# Patient Record
Sex: Female | Born: 1960 | Race: White | Hispanic: No | Marital: Single | Smoking: Never smoker
Health system: Southern US, Community
[De-identification: ages and names within clinical notes are randomized; demographics above are authoritative.]

## PROBLEM LIST (undated history)

## (undated) DIAGNOSIS — E119 Type 2 diabetes mellitus without complications: Secondary | ICD-10-CM

## (undated) DIAGNOSIS — I10 Essential (primary) hypertension: Secondary | ICD-10-CM

## (undated) DIAGNOSIS — E785 Hyperlipidemia, unspecified: Secondary | ICD-10-CM

## (undated) DIAGNOSIS — C539 Malignant neoplasm of cervix uteri, unspecified: Secondary | ICD-10-CM

---

## 2000-05-10 DIAGNOSIS — C539 Malignant neoplasm of cervix uteri, unspecified: Secondary | ICD-10-CM

## 2000-05-10 HISTORY — PX: ABDOMINAL HYSTERECTOMY: SHX81

## 2000-05-10 HISTORY — DX: Malignant neoplasm of cervix uteri, unspecified: C53.9

## 2012-05-22 ENCOUNTER — Inpatient Hospital Stay (HOSPITAL_COMMUNITY)
Admission: EM | Admit: 2012-05-22 | Discharge: 2012-05-26 | DRG: 180 | Disposition: A | Discharge status: Home / Self Care | Payer: BC Managed Care – PPO | Attending: Internal Medicine | Admitting: Internal Medicine

## 2012-05-22 ENCOUNTER — Encounter (HOSPITAL_COMMUNITY): Payer: Self-pay | Admitting: *Deleted

## 2012-05-22 ENCOUNTER — Emergency Department (HOSPITAL_COMMUNITY): Payer: BC Managed Care – PPO

## 2012-05-22 ENCOUNTER — Inpatient Hospital Stay (HOSPITAL_COMMUNITY): Payer: BC Managed Care – PPO

## 2012-05-22 DIAGNOSIS — Z8541 Personal history of malignant neoplasm of cervix uteri: Secondary | ICD-10-CM

## 2012-05-22 DIAGNOSIS — I10 Essential (primary) hypertension: Secondary | ICD-10-CM | POA: Diagnosis present

## 2012-05-22 DIAGNOSIS — R739 Hyperglycemia, unspecified: Secondary | ICD-10-CM

## 2012-05-22 DIAGNOSIS — Z90710 Acquired absence of both cervix and uterus: Secondary | ICD-10-CM

## 2012-05-22 DIAGNOSIS — K565 Intestinal adhesions [bands], unspecified as to partial versus complete obstruction: Principal | ICD-10-CM | POA: Diagnosis present

## 2012-05-22 DIAGNOSIS — E119 Type 2 diabetes mellitus without complications: Secondary | ICD-10-CM | POA: Diagnosis present

## 2012-05-22 DIAGNOSIS — E785 Hyperlipidemia, unspecified: Secondary | ICD-10-CM | POA: Diagnosis present

## 2012-05-22 DIAGNOSIS — E860 Dehydration: Secondary | ICD-10-CM | POA: Diagnosis present

## 2012-05-22 DIAGNOSIS — Z79899 Other long term (current) drug therapy: Secondary | ICD-10-CM

## 2012-05-22 DIAGNOSIS — K56609 Unspecified intestinal obstruction, unspecified as to partial versus complete obstruction: Secondary | ICD-10-CM

## 2012-05-22 DIAGNOSIS — Z923 Personal history of irradiation: Secondary | ICD-10-CM

## 2012-05-22 DIAGNOSIS — K760 Fatty (change of) liver, not elsewhere classified: Secondary | ICD-10-CM

## 2012-05-22 DIAGNOSIS — K7689 Other specified diseases of liver: Secondary | ICD-10-CM | POA: Diagnosis present

## 2012-05-22 DIAGNOSIS — D72829 Elevated white blood cell count, unspecified: Secondary | ICD-10-CM | POA: Diagnosis present

## 2012-05-22 HISTORY — DX: Hyperlipidemia, unspecified: E78.5

## 2012-05-22 HISTORY — DX: Essential (primary) hypertension: I10

## 2012-05-22 HISTORY — DX: Type 2 diabetes mellitus without complications: E11.9

## 2012-05-22 HISTORY — DX: Malignant neoplasm of cervix uteri, unspecified: C53.9

## 2012-05-22 LAB — GLUCOSE, CAPILLARY
Glucose-Capillary: 201 mg/dL — ABNORMAL HIGH (ref 70–99)
Glucose-Capillary: 119 mg/dL — ABNORMAL HIGH (ref 70–99)
Glucose-Capillary: 129 mg/dL — ABNORMAL HIGH (ref 70–99)

## 2012-05-22 LAB — URINALYSIS, ROUTINE W REFLEX MICROSCOPIC
Glucose, UA: NEGATIVE mg/dL
Hgb urine dipstick: NEGATIVE
Specific Gravity, Urine: >1.03 — ABNORMAL HIGH (ref 1.005–1.030)

## 2012-05-22 LAB — CBC WITH DIFFERENTIAL/PLATELET
Eosinophils Relative: 1 % (ref 0–5)
HCT: 43.9 % (ref 36.0–46.0)
Lymphocytes Relative: 21 % (ref 12–46)
Lymphs Abs: 3.3 10*3/uL (ref 0.7–4.0)
MCV: 81.6 fL (ref 78.0–100.0)
Monocytes Absolute: 1 10*3/uL (ref 0.1–1.0)
Monocytes Relative: 6 % (ref 3–12)
RBC: 5.38 MIL/uL — ABNORMAL HIGH (ref 3.87–5.11)
WBC: 15.9 10*3/uL — ABNORMAL HIGH (ref 4.0–10.5)

## 2012-05-22 LAB — COMPREHENSIVE METABOLIC PANEL
ALT: 35 U/L (ref 0–35)
CO2: 27 mEq/L (ref 19–32)
Calcium: 11.1 mg/dL — ABNORMAL HIGH (ref 8.4–10.5)
Creatinine, Ser: 0.51 mg/dL (ref 0.50–1.10)
GFR calc Af Amer: >90 mL/min (ref >90)
GFR calc non Af Amer: >90 mL/min (ref >90)
Glucose, Bld: 295 mg/dL — ABNORMAL HIGH (ref 70–99)

## 2012-05-22 MED ORDER — INSULIN ASPART 100 UNIT/ML ~~LOC~~ SOLN
0.0000 [IU] | SUBCUTANEOUS | Status: DC
  Administered 2012-05-22 – 2012-05-23 (×3): 2 [IU] via SUBCUTANEOUS
  Administered 2012-05-23: 1 [IU] via SUBCUTANEOUS
  Administered 2012-05-23: 2 [IU] via SUBCUTANEOUS
  Administered 2012-05-24 (×2): 1 [IU] via SUBCUTANEOUS

## 2012-05-22 MED ORDER — HEPARIN SODIUM (PORCINE) 5000 UNIT/ML IJ SOLN
5000.0000 [IU] | Freq: Three times a day (TID) | INTRAMUSCULAR | Status: DC
  Administered 2012-05-22 – 2012-05-25 (×11): 5000 [IU] via SUBCUTANEOUS
  Filled 2012-05-22 (×12): qty 1

## 2012-05-22 MED ORDER — ONDANSETRON HCL 4 MG/2ML IJ SOLN
4.0000 mg | Freq: Once | INTRAMUSCULAR | Status: AC
  Administered 2012-05-22: 4 mg via INTRAVENOUS
  Filled 2012-05-22: qty 2

## 2012-05-22 MED ORDER — HYDROMORPHONE HCL PF 1 MG/ML IJ SOLN
0.5000 mg | INTRAMUSCULAR | Status: DC | PRN
  Administered 2012-05-22 – 2012-05-23 (×2): 0.5 mg via INTRAVENOUS
  Filled 2012-05-22 (×2): qty 1

## 2012-05-22 MED ORDER — MORPHINE SULFATE 4 MG/ML IJ SOLN
4.0000 mg | Freq: Once | INTRAMUSCULAR | Status: AC
  Administered 2012-05-22: 4 mg via INTRAVENOUS
  Filled 2012-05-22: qty 1

## 2012-05-22 MED ORDER — SODIUM CHLORIDE 0.9 % IV SOLN
INTRAVENOUS | Status: AC
  Administered 2012-05-22: 75 mL/h via INTRAVENOUS

## 2012-05-22 MED ORDER — HYDROMORPHONE HCL PF 1 MG/ML IJ SOLN
1.0000 mg | INTRAMUSCULAR | Status: DC | PRN
  Administered 2012-05-22: 1 mg via INTRAVENOUS

## 2012-05-22 MED ORDER — INSULIN GLARGINE 100 UNIT/ML ~~LOC~~ SOLN
5.0000 [IU] | Freq: Every day | SUBCUTANEOUS | Status: DC
  Administered 2012-05-22 – 2012-05-23 (×2): 5 [IU] via SUBCUTANEOUS

## 2012-05-22 MED ORDER — CLONIDINE HCL 0.1 MG/24HR TD PTWK
0.1000 mg | MEDICATED_PATCH | TRANSDERMAL | Status: DC
  Administered 2012-05-22: 0.1 mg via TRANSDERMAL
  Filled 2012-05-22: qty 1

## 2012-05-22 MED ORDER — PANTOPRAZOLE SODIUM 40 MG IV SOLR
40.0000 mg | Freq: Once | INTRAVENOUS | Status: AC
  Administered 2012-05-22: 40 mg via INTRAVENOUS

## 2012-05-22 MED ORDER — ONDANSETRON HCL 4 MG/2ML IJ SOLN
4.0000 mg | Freq: Four times a day (QID) | INTRAMUSCULAR | Status: DC
  Administered 2012-05-23 – 2012-05-24 (×3): 4 mg via INTRAVENOUS
  Filled 2012-05-22 (×5): qty 2

## 2012-05-22 MED ORDER — ACETAMINOPHEN 650 MG RE SUPP: 650.0000 mg | Freq: Four times a day (QID) | RECTAL | Status: DC | PRN

## 2012-05-22 MED ORDER — INSULIN ASPART 100 UNIT/ML ~~LOC~~ SOLN
0.0000 [IU] | SUBCUTANEOUS | Status: DC
  Administered 2012-05-22: 5 [IU] via SUBCUTANEOUS
  Filled 2012-05-22: qty 1

## 2012-05-22 MED ORDER — ACETAMINOPHEN 325 MG PO TABS: 650.0000 mg | ORAL_TABLET | Freq: Four times a day (QID) | ORAL | Status: DC | PRN

## 2012-05-22 MED ORDER — HYDROMORPHONE HCL PF 1 MG/ML IJ SOLN
INTRAMUSCULAR | Status: AC
  Administered 2012-05-22: 1 mg via INTRAVENOUS
  Filled 2012-05-22: qty 1

## 2012-05-22 MED ORDER — IOHEXOL 300 MG/ML  SOLN
100.0000 mL | Freq: Once | INTRAMUSCULAR | Status: AC | PRN
  Administered 2012-05-22: 100 mL via INTRAVENOUS

## 2012-05-22 MED ORDER — POTASSIUM CHLORIDE IN NACL 40-0.9 MEQ/L-% IV SOLN
INTRAVENOUS | Status: DC
  Administered 2012-05-22 (×2): 100 mL/h via INTRAVENOUS
  Administered 2012-05-23 – 2012-05-26 (×6): via INTRAVENOUS
  Filled 2012-05-22 (×3): qty 1000

## 2012-05-22 MED ORDER — PROMETHAZINE HCL 12.5 MG PO TABS: 12.5000 mg | ORAL_TABLET | Freq: Four times a day (QID) | ORAL | Status: DC | PRN

## 2012-05-22 MED ORDER — ALBUTEROL SULFATE (5 MG/ML) 0.5% IN NEBU: 2.5000 mg | INHALATION_SOLUTION | RESPIRATORY_TRACT | Status: DC | PRN

## 2012-05-22 MED ORDER — PANTOPRAZOLE SODIUM 40 MG IV SOLR
INTRAVENOUS | Status: AC
  Administered 2012-05-22: 40 mg via INTRAVENOUS
  Filled 2012-05-22: qty 40

## 2012-05-22 MED ORDER — PANTOPRAZOLE SODIUM 40 MG IV SOLR
40.0000 mg | Freq: Every day | INTRAVENOUS | Status: DC
  Administered 2012-05-23 – 2012-05-25 (×3): 40 mg via INTRAVENOUS
  Filled 2012-05-22 (×3): qty 40

## 2012-05-22 MED ORDER — HYDRALAZINE HCL 20 MG/ML IJ SOLN: 5.0000 mg | INTRAMUSCULAR | Status: DC | PRN

## 2012-05-22 NOTE — H&P (Signed)
[facility] History and Physical  Brandi Branch WGN:[id number] DOB: [birth year] DOA: 05/22/2012  Referring physician: ED physician, Dr. Hyacinth Meeker PCP: Katherina Right, MD  Specialists:   Chief Complaint: Abdominal pain  HPI: Brandi Branch is a 52 y.o. female with a history significant for cervical cancer, status post hysterectomy, status post C-section, and diabetes mellitus, who presents to the emergency department today with a chief complaint of abdominal pain. Her pain started last night. It is located below her umbilicus. It has been intermittent but has progressed to constant. She describes the pain as a "knot". She also describes the pain as a sharp pain. It does not radiate. At its worse, its an 8/10 in intensity. She had one episode of vomiting which occurred in the emergency department this morning. No vomiting at home, but she has been persistently nauseated. Her last bowel movement was 2 days ago. She has intermittent loose stools and constipation chronically. She denies bright red blood in her emesis. She denies coffee grounds emesis. She denies black tarry stools and bright red blood per rectum. She has no prior history of small bowel obstruction. She denies associated fever and chills. She denies pain with urination.   In the emergency department, she is mildly tachycardic, afebrile, and with mild hypertension. Her lab data are significant for a glucose of 295, WBC of 15.9 and calcium of 11.1. CT of her abdomen and pelvis reveals a small bowel obstruction with gradual transition to normal caliber of the small bowel in the distal ileum, hepatic steatosis, and small pelvic free fluid. She is being admitted for further evaluation and management.  Review of Systems: Positive as above in history present illness. In addition, she has hot flashes. Otherwise review of systems is negative.  Past Medical History  Diagnosis Date  . Diabetes mellitus, type II   . Hypertension   . Hyperlipidemia     . Cervical cancer 2002    Status post hysterectomy and radiation therapy   Past Surgical History  Procedure Date  . Abdominal hysterectomy 2002  . Cesarean section    Social History: She is divorced. She lives in [place]. She has one daughter Judeth Cornfield. She is employed. She denies tobacco and illicit drug use. She drinks alcohol on rare occasions.   Allergies  Allergen Reactions  . Macrobid (Nitrofurantoin Macrocrystal)   . Sulfa Antibiotics     Family history: Her mother is in her 42s. She has COPD, lupus, and diabetes mellitus. Her father is 66 years of age. He has hypertension.  Prior to Admission medications   Medication Sig Start Date End Date Taking? Authorizing Provider  cloNIDine (CATAPRES) 0.2 MG tablet Take 0.1 mg by mouth at bedtime.   Yes Historical Provider, MD  cyclobenzaprine (FLEXERIL) 10 MG tablet Take 10 mg by mouth at bedtime.   Yes Historical Provider, MD  glipiZIDE (GLUCOTROL XL) 5 MG 24 hr tablet Take 5 mg by mouth 2 (two) times daily.   Yes Historical Provider, MD  lisinopril (PRINIVIL,ZESTRIL) 20 MG tablet Take 20 mg by mouth daily.   Yes Historical Provider, MD  Multiple Vitamin (MULTIVITAMIN WITH MINERALS) TABS Take 1 tablet by mouth daily.   Yes Historical Provider, MD  omeprazole (PRILOSEC) 20 MG capsule Take 20 mg by mouth daily.   Yes Historical Provider, MD  pravastatin (PRAVACHOL) 20 MG tablet Take 20 mg by mouth daily.   Yes Historical Provider, MD  sitaGLIPtan-metformin (JANUMET) 50-1000 MG per tablet Take 1 tablet by mouth at bedtime.  Yes Historical Provider, MD  venlafaxine XR (EFFEXOR-XR) 150 MG 24 hr capsule Take 150 mg by mouth at bedtime.   Yes Historical Provider, MD   Physical Exam: Filed Vitals:   05/22/12 0600 05/22/12 1006 05/22/12 1214  BP: 152/79 155/92 147/92  Pulse: 103 104 105  Temp: 97.7 F (36.5 C)    TempSrc: Oral    Resp: 18 22 18   Height: 5\' 5"  (1.651 m)    Weight: 81.647 kg (180 lb)    SpO2: 96% 92% 92%      General:  52 year old Caucasian woman lying in bed, in no acute distress. She has just received Dilaudid, so she is mildly sedated.  Eyes: Pupils are equal, round, and reactive to light. Extraocular movements are intact. Conjunctivae are clear. Sclerae are white.  ENT: Oropharynx reveals mildly dry mucous membranes. No posterior exudates or erythema.  Neck: Supple, no adenopathy, no thyromegaly, no JVD.  Cardiovascular: S1, S2, with borderline tachycardia.  Respiratory: Clear to auscultation bilaterally.  Abdomen: No bowel sounds auscultated. Abdomen is obese. Mildly tender around the umbilicus. Mildly distended. No masses palpated. (Status post 1 mg Dilaudid).  Skin: Good turgor. No rashes.  Musculoskeletal: Pedal pulses palpable. No pedal edema. No acute hot red joints.  Psychiatric: Flat affect. Initially sedated, but becomes alert and oriented x3.  Neurologic: Cranial nerves II through XII are intact. Strength is 5 over 5 throughout. Sensation is grossly intact.  Labs on Admission:  Basic Metabolic Panel:  Lab 05/22/12 1478  NA 139  K 3.8  CL 98  CO2 27  GLUCOSE 295*  BUN 11  CREATININE 0.51  CALCIUM 11.1*  MG --  PHOS --   Liver Function Tests:  Lab 05/22/12 0609  AST 27  ALT 35  ALKPHOS 104  BILITOT 0.6  PROT 8.2  ALBUMIN 4.5   No results found for this basename: LIPASE:5,AMYLASE:5 in the last 168 hours No results found for this basename: AMMONIA:5 in the last 168 hours CBC:  Lab 05/22/12 0609  WBC 15.9*  NEUTROABS 11.4*  HGB 14.9  HCT 43.9  MCV 81.6  PLT 336   Cardiac Enzymes: No results found for this basename: CKTOTAL:5,CKMB:5,CKMBINDEX:5,TROPONINI:5 in the last 168 hours  BNP (last 3 results) No results found for this basename: PROBNP:3 in the last 8760 hours CBG:  Lab 05/22/12 1212 05/22/12 1016  GLUCAP 201* 254*    Radiological Exams on Admission: Ct Abdomen Pelvis W Contrast  05/22/2012  *RADIOLOGY REPORT*  Clinical Data:  Right upper quadrant pain with nausea and vomiting.  CT ABDOMEN AND PELVIS WITH CONTRAST  Technique:  Multidetector CT imaging of the abdomen and pelvis was performed following the standard protocol during bolus administration of intravenous contrast.  Contrast: OMNIPAQUE IOHEXOL 300 MG/ML  SOLN  Comparison: None.  Findings: Lung bases show dependent atelectasis with scattered scarring.  Heart size normal.  No pericardial or pleural effusion.  Liver is decreased in attenuation diffusely.  Liver, gallbladder, adrenal glands, kidneys, spleen, pancreas, stomach and proximal small bowel are otherwise unremarkable.  Mid and distal small bowel are fluid filled and dilated with gradual transition to normal caliber small bowel in the anatomic pelvis (images 70-82).  There is a small bowel feces sign, which is somewhat nonspecific in this setting.  Surgical clips are seen in the region of the cecum. Colon is unremarkable.  Surgical clips are seen along the iliac chains and pelvic sidewalls.  Small pelvic free fluid.  Atherosclerotic calcification of the arterial vasculature without  abdominal aortic aneurysm.  No pathologically enlarged lymph nodes.  Mild laxity is seen in the ventral midline abdominal wall, without overt hernia.  No worrisome lytic or sclerotic lesions. A bone island is seen in the L5 vertebral body.  IMPRESSION:  1.  Small bowel obstruction with gradual transition to normal caliber small bowel in the distal ileum. 2.  Hepatic steatosis. 3.  Small pelvic free fluid.   Original Report Authenticated By: Leanna Battles, M.D.     EKG: Not ordered.  Assessment/Plan Active Problems:  Small bowel obstruction  Hypercalcemia  Leukocytosis  Hepatic steatosis  Diabetes mellitus, type 2  Hypertension  Dehydration   1. This is a 52 year old woman with a history significant for prior abdominal surgeries including a hysterectomy and C-section, who presents with a small bowel obstruction. She is  dehydrated as evidenced by an elevated urine specific gravity and hypercalcemia. Her leukocytosis is likely reactive. Her blood glucose is greater than 200, but she admits that she had not taken her glipizide and Janumet because of pain and nausea. An NG tube was inserted in the emergency department. It drained more than 300 cc of tan gastric contents.        Plan: 1. Continue NG tube for decompression. We'll keep her n.p.o. 2. Pain management with as needed IV Dilaudid. Antiemetic treatment with scheduled IV Zofran. IV Protonix empirically. 3. We'll treat her diabetes with sliding scale NovoLog and Lantus. 4. We'll continue Catapres via transdermal route. Will order when necessary hydralazine for treatment of hypertension. 5. IV fluid hydration. 6. Consult general surgery.   Code Status: Full code Family Communication: Discussed with the daughter Disposition Plan: Plan discharge to home in 3-4 days.  Time spent: One hour.  [facility] Pager [phone]  If 7PM-7AM, please contact night-coverage [email or web address] Password [email or web address] 05/22/2012, 1:00 PM

## 2012-05-22 NOTE — ED Notes (Signed)
Pt c/o pain to the upper abd with one episode of vomiting. Pt denies diarrhea at this time. Pt describes pain as stabbing.

## 2012-05-22 NOTE — ED Provider Notes (Addendum)
History     CSN: [id number]  Arrival date & time 05/22/12  0547   First MD Initiated Contact with Patient 05/22/12 [phone]      Chief Complaint  Patient presents with  . Abdominal Pain    (Consider location/radiation/quality/duration/timing/severity/associated sxs/prior treatment) HPI Comments: 52 y/o female with hx of hysterectomy and C section in the past who presents with c/o abd pain which is mid and RLQ in location - she states that it came on overnight - is associated with vomiting on arrival but no diarrhea, no fevers and no sx until after dinner last night.  Worse with palpation.  She has no other c/o including no CP, cough, sob, swelling, rash, dysuria or diarrhea.  Sx are severe and come in waves.  Patient is a 52 y.o. female presenting with abdominal pain. The history is provided by the patient and a relative.  Abdominal Pain The primary symptoms of the illness include abdominal pain.    Past Medical History  Diagnosis Date  . Diabetes mellitus without complication     Past Surgical History  Procedure Date  . Abdominal hysterectomy   . Cesarean section     History reviewed. No pertinent family history.  History  Substance Use Topics  . Smoking status: Never Smoker   . Smokeless tobacco: Not on file  . Alcohol Use: No    OB History    Grav Para Term Preterm Abortions TAB SAB Ect Mult Living                  Review of Systems  Gastrointestinal: Positive for abdominal pain.  All other systems reviewed and are negative.    Allergies  Macrobid and Sulfa antibiotics  Home Medications   Current Outpatient Rx  Name  Route  Sig  Dispense  Refill  . LISINOPRIL 20 MG PO TABS   Oral   Take 20 mg by mouth daily.         Marland Kitchen PRAVASTATIN SODIUM 20 MG PO TABS   Oral   Take 20 mg by mouth daily.         Marland Kitchen SITAGLIPTIN-METFORMIN HCL 50-1000 MG PO TABS   Oral   Take 1 tablet by mouth daily.           BP 152/79  Pulse 103  Temp 97.7 F (36.5 C)  (Oral)  Resp 18  Ht 5\' 5"  (1.651 m)  Wt 180 lb (81.647 kg)  BMI 29.95 kg/m2  SpO2 96%  Physical Exam  Nursing note and vitals reviewed. Constitutional: She appears well-developed and well-nourished.       Uncomfortable appearing  HENT:  Head: Normocephalic and atraumatic.  Mouth/Throat: Oropharynx is clear and moist. No oropharyngeal exudate.  Eyes: Conjunctivae normal and EOM are normal. Pupils are equal, round, and reactive to light. Right eye exhibits no discharge. Left eye exhibits no discharge. No scleral icterus.  Neck: Normal range of motion. Neck supple. No JVD present. No thyromegaly present.  Cardiovascular: Normal rate, regular rhythm, normal heart sounds and intact distal pulses.  Exam reveals no gallop and no friction rub.   No murmur heard. Pulmonary/Chest: Effort normal and breath sounds normal. No respiratory distress. She has no wheezes. She has no rales.  Abdominal: Soft. Bowel sounds are normal. She exhibits no distension and no mass. There is tenderness ( focal ttp in the RLQ and mid abd with mild distention ).       No guarding, no peritoneal signs, very soft  Musculoskeletal:  Normal range of motion. She exhibits no edema and no tenderness.  Lymphadenopathy:    She has no cervical adenopathy.  Neurological: She is alert. Coordination normal.  Skin: Skin is warm and dry. No rash noted. No erythema.  Psychiatric: She has a normal mood and affect. Her behavior is normal.    ED Course  Procedures (including critical care time)  Labs Reviewed  URINALYSIS, ROUTINE W REFLEX MICROSCOPIC - Abnormal; Notable for the following:    Specific Gravity, Urine >1.030 (*)     Ketones, ur 15 (*)     All other components within normal limits  CBC WITH DIFFERENTIAL - Abnormal; Notable for the following:    WBC 15.9 (*)     RBC 5.38 (*)     Neutro Abs 11.4 (*)     All other components within normal limits  COMPREHENSIVE METABOLIC PANEL - Abnormal; Notable for the following:      Glucose, Bld 295 (*)     Calcium 11.1 (*)     All other components within normal limits   Ct Abdomen Pelvis W Contrast  05/22/2012  *RADIOLOGY REPORT*  Clinical Data: Right upper quadrant pain with nausea and vomiting.  CT ABDOMEN AND PELVIS WITH CONTRAST  Technique:  Multidetector CT imaging of the abdomen and pelvis was performed following the standard protocol during bolus administration of intravenous contrast.  Contrast: OMNIPAQUE IOHEXOL 300 MG/ML  SOLN  Comparison: None.  Findings: Lung bases show dependent atelectasis with scattered scarring.  Heart size normal.  No pericardial or pleural effusion.  Liver is decreased in attenuation diffusely.  Liver, gallbladder, adrenal glands, kidneys, spleen, pancreas, stomach and proximal small bowel are otherwise unremarkable.  Mid and distal small bowel are fluid filled and dilated with gradual transition to normal caliber small bowel in the anatomic pelvis (images 70-82).  There is a small bowel feces sign, which is somewhat nonspecific in this setting.  Surgical clips are seen in the region of the cecum. Colon is unremarkable.  Surgical clips are seen along the iliac chains and pelvic sidewalls.  Small pelvic free fluid.  Atherosclerotic calcification of the arterial vasculature without abdominal aortic aneurysm.  No pathologically enlarged lymph nodes.  Mild laxity is seen in the ventral midline abdominal wall, without overt hernia.  No worrisome lytic or sclerotic lesions. A bone island is seen in the L5 vertebral body.  IMPRESSION:  1.  Small bowel obstruction with gradual transition to normal caliber small bowel in the distal ileum. 2.  Hepatic steatosis. 3.  Small pelvic free fluid.   Original Report Authenticated By: Leanna Battles, M.D.      1. SBO (small bowel obstruction)   2. Hyperglycemia   3. Hepatic steatosis       MDM  Pt has abd pain that could be c/w appendicitis - or bowel obstruction but no distention ad this time.  Will  proceed with labs, ua and CT abd to evaluate for above.  Pain meds ordered - parenteral morphine and zofran.  Labs interpreted - has Leukocytosis with Neutrophil predominance, high SG of UA suggests dehdyration - normal renal function, hyperglycemia present.    CT pending at this time.    12 - CT shows that she has a SBO, pt still tender and mildly distended at this time. NG ordered, consult to hospitalist.  Admit to Dr. Pollyann Savoy, MD 05/22/12 1914  Vida Roller, MD 05/22/12 [phone]

## 2012-05-22 NOTE — ED Notes (Signed)
Attempted to call report to unit 300.  RN unavailable.  Will call later (ext. [phone])

## 2012-05-22 NOTE — ED Notes (Signed)
Beeped to [phone].Sherrie Mustache

## 2012-05-22 NOTE — ED Notes (Signed)
19F NG inserted L nare w/out difficulty.  Immediate return on 300cc tan gastric contents.

## 2012-05-23 DIAGNOSIS — I10 Essential (primary) hypertension: Secondary | ICD-10-CM

## 2012-05-23 DIAGNOSIS — D72829 Elevated white blood cell count, unspecified: Secondary | ICD-10-CM

## 2012-05-23 LAB — COMPREHENSIVE METABOLIC PANEL
Alkaline Phosphatase: 72 U/L (ref 39–117)
BUN: 6 mg/dL (ref 6–23)
Chloride: 105 mEq/L (ref 96–112)
Creatinine, Ser: 0.5 mg/dL (ref 0.50–1.10)
GFR calc Af Amer: >90 mL/min (ref >90)
GFR calc non Af Amer: >90 mL/min (ref >90)
Glucose, Bld: 151 mg/dL — ABNORMAL HIGH (ref 70–99)
Potassium: 3.9 mEq/L (ref 3.5–5.1)
Total Bilirubin: 0.7 mg/dL (ref 0.3–1.2)

## 2012-05-23 LAB — GLUCOSE, CAPILLARY
Glucose-Capillary: 166 mg/dL — ABNORMAL HIGH (ref 70–99)
Glucose-Capillary: 108 mg/dL — ABNORMAL HIGH (ref 70–99)

## 2012-05-23 MED ORDER — DIPHENHYDRAMINE HCL 50 MG/ML IJ SOLN
25.0000 mg | Freq: Once | INTRAMUSCULAR | Status: AC
Start: 2012-05-23 — End: 2012-05-23
  Administered 2012-05-23: 25 mg via INTRAVENOUS
  Filled 2012-05-23: qty 1

## 2012-05-23 MED ORDER — PHENOL 1.4 % MT LIQD
1.0000 | OROMUCOSAL | Status: DC | PRN
  Administered 2012-05-23: 1 via OROMUCOSAL
  Filled 2012-05-23: qty 177

## 2012-05-23 NOTE — Care Management Note (Signed)
    Page 1 of 1   05/26/2012     6:04:52 PM   CARE MANAGEMENT NOTE 05/26/2012  Patient:  Brandi Branch,Brandi Branch   Account Number:  [id number]  Date Initiated:  05/23/2012  Documentation initiated by:  Rosemary Holms  Subjective/Objective Assessment:   Pt admitted for abdominal pain. Lives alone. Plan to DC home     Action/Plan:   Anticipated DC Date:  05/25/2012   Anticipated DC Plan:  HOME/SELF CARE      DC Planning Services  CM consult      Choice offered to / List presented to:             Status of service:  Completed, signed off Medicare Important Message given?   (If response is "NO", the following Medicare IM given date fields will be blank) Date Medicare IM given:   Date Additional Medicare IM given:    Discharge Disposition:  HOME/SELF CARE  Per UR Regulation:    If discussed at Long Length of Stay Meetings, dates discussed:    Comments:  05/23/12 Rosemary Holms RN BSN CM

## 2012-05-23 NOTE — Progress Notes (Signed)
UR Chart Review Completed  

## 2012-05-23 NOTE — Progress Notes (Signed)
Subjective: The patient complains of a sore throat from the NG tube. Chloraseptic spray ordered. She has less abdominal pain. She has been eating ice chips. She has less abdominal pain. She passed gas this morning.  Objective: Vital signs in last 24 hours: Filed Vitals:   05/22/12 1214 05/22/12 1315 05/22/12 2150 05/23/12 0651  BP: 147/92 138/81 138/76 149/81  Pulse: 105 89 91 105  Temp:  97.2 F (36.2 C) 97.9 F (36.6 C) 98 F (36.7 C)  TempSrc:  Oral Oral Oral  Resp: 18 20 20 19   Height:  5\' 5"  (1.651 m)    Weight:  81.647 kg (180 lb)    SpO2: 92% 93% 100% 93%    Intake/Output Summary (Last 24 hours) at 05/23/12 1045 Last data filed at 05/23/12 0600  Gross per 24 hour  Intake 1941.67 ml  Output    800 ml  Net 1141.67 ml    Weight change: 0 kg (0 lb)  Physical exam: General: Alert 52 year old Caucasian woman laying in bed, in no acute distress. Heart: S1, S2, with borderline tachycardia. Lungs: Clear to auscultation bilaterally. Abdomen: Positive bowel sounds, softer, minimally tender around the umbilicus, no distention. NG drainage output 800 cc overnight (not accounting what was drained in the ED). Currently, light bilious fluid in the container. Extremities: No pedal edema.    Lab Results: Basic Metabolic Panel:  Basename 05/23/12 0558 05/22/12 0609  NA 140 139  K 3.9 3.8  CL 105 98  CO2 29 27  GLUCOSE 151* 295*  BUN 6 11  CREATININE 0.50 0.51  CALCIUM 9.1 11.1*  MG -- --  PHOS -- --   Liver Function Tests:  Basename 05/23/12 0558 05/22/12 0609  AST 15 27  ALT 22 35  ALKPHOS 72 104  BILITOT 0.7 0.6  PROT 6.1 8.2  ALBUMIN 3.1* 4.5   No results found for this basename: LIPASE:2,AMYLASE:2 in the last 72 hours No results found for this basename: AMMONIA:2 in the last 72 hours CBC:  Basename 05/22/12 0609  WBC 15.9*  NEUTROABS 11.4*  HGB 14.9  HCT 43.9  MCV 81.6  PLT 336   Cardiac Enzymes: No results found for this basename:  CKTOTAL:3,CKMB:3,CKMBINDEX:3,TROPONINI:3 in the last 72 hours BNP: No results found for this basename: PROBNP:3 in the last 72 hours D-Dimer: No results found for this basename: DDIMER:2 in the last 72 hours CBG:  Basename 05/23/12 0722 05/23/12 0313 05/22/12 2322 05/22/12 2046 05/22/12 1632 05/22/12 1212  GLUCAP 172* 145* 129* 119* 184* 201*   Hemoglobin A1C:  Basename 05/22/12 1319  HGBA1C 7.8*   Fasting Lipid Panel: No results found for this basename: CHOL,HDL,LDLCALC,TRIG,CHOLHDL,LDLDIRECT in the last 72 hours Thyroid Function Tests: No results found for this basename: TSH,T4TOTAL,FREET4,T3FREE,THYROIDAB in the last 72 hours Anemia Panel: No results found for this basename: VITAMINB12,FOLATE,FERRITIN,TIBC,IRON,RETICCTPCT in the last 72 hours Coagulation: No results found for this basename: LABPROT:2,INR:2 in the last 72 hours Urine Drug Screen: Drugs of Abuse  No results found for this basename: labopia, cocainscrnur, labbenz, amphetmu, thcu, labbarb    Alcohol Level: No results found for this basename: ETH:2 in the last 72 hours Urinalysis:  Basename 05/22/12 0608  COLORURINE YELLOW  LABSPEC >1.030*  PHURINE 5.5  GLUCOSEU NEGATIVE  HGBUR NEGATIVE  BILIRUBINUR NEGATIVE  KETONESUR 15*  PROTEINUR NEGATIVE  UROBILINOGEN 0.2  NITRITE NEGATIVE  LEUKOCYTESUR NEGATIVE   Misc. Labs:   Micro: No results found for this or any previous visit (from the past 240 hour(s)).  Studies/Results: Dg  Abd 1 View  05/22/2012  *RADIOLOGY REPORT*  Clinical Data: Small bowel obstruction, follow-up, past history of cervical cancer post hysterectomy, radiation therapy, hypertension, diabetes  ABDOMEN - 1 VIEW  Comparison: CT abdomen and pelvis 05/22/2012  Findings: Tip of nasogastric tube projects over gastric antrum. Numerous surgical clips in pelvis bilaterally. Persistent dilatation of small bowel loops in mid abdomen compatible with small bowel obstruction. Minimal bowel wall  thickening of the dilated small bowel loops. Small amount of gas and stool is seen throughout the proximal half of the colon. Bones demineralized. No urinary tract calcification.  IMPRESSION: Persistent dilatation of small bowel loops compatible with small bowel obstruction. Minimal bowel wall thickening of the dilated loops in the mid abdomen, nonspecific.   Original Report Authenticated By: Ulyses Southward, M.D.    Ct Abdomen Pelvis W Contrast  05/22/2012  *RADIOLOGY REPORT*  Clinical Data: Right upper quadrant pain with nausea and vomiting.  CT ABDOMEN AND PELVIS WITH CONTRAST  Technique:  Multidetector CT imaging of the abdomen and pelvis was performed following the standard protocol during bolus administration of intravenous contrast.  Contrast: OMNIPAQUE IOHEXOL 300 MG/ML  SOLN  Comparison: None.  Findings: Lung bases show dependent atelectasis with scattered scarring.  Heart size normal.  No pericardial or pleural effusion.  Liver is decreased in attenuation diffusely.  Liver, gallbladder, adrenal glands, kidneys, spleen, pancreas, stomach and proximal small bowel are otherwise unremarkable.  Mid and distal small bowel are fluid filled and dilated with gradual transition to normal caliber small bowel in the anatomic pelvis (images 70-82).  There is a small bowel feces sign, which is somewhat nonspecific in this setting.  Surgical clips are seen in the region of the cecum. Colon is unremarkable.  Surgical clips are seen along the iliac chains and pelvic sidewalls.  Small pelvic free fluid.  Atherosclerotic calcification of the arterial vasculature without abdominal aortic aneurysm.  No pathologically enlarged lymph nodes.  Mild laxity is seen in the ventral midline abdominal wall, without overt hernia.  No worrisome lytic or sclerotic lesions. A bone island is seen in the L5 vertebral body.  IMPRESSION:  1.  Small bowel obstruction with gradual transition to normal caliber small bowel in the distal ileum.  2.  Hepatic steatosis. 3.  Small pelvic free fluid.   Original Report Authenticated By: Leanna Battles, M.D.     Medications:  Scheduled:   . cloNIDine  0.1 mg Transdermal Weekly  . heparin  5,000 Units Subcutaneous Q8H  . insulin aspart  0-9 Units Subcutaneous Q4H  . insulin glargine  5 Units Subcutaneous QHS  . ondansetron (ZOFRAN) IV  4 mg Intravenous Q6H  . pantoprazole (PROTONIX) IV  40 mg Intravenous Daily   Continuous:   . 0.9 % NaCl with KCl 40 mEq / L 100 mL/hr at 05/23/12 1011   ZOX:[id number], acetaminophen, albuterol, hydrALAZINE, HYDROmorphone (DILAUDID) injection, phenol, promethazine  Assessment: Active Problems:  Small bowel obstruction  Hypercalcemia  Leukocytosis  Hepatic steatosis  Diabetes mellitus, type 2  Hypertension  Dehydration    The patient now has bowel sounds. She has passed flatus. She had 800 cc of NG drainage overnight, not counting what was drained in the ED. It is likely she has had over a liter of NG tube drainage. Currently, the container has lightly colored bilious fluid which may be in part from ice she has been eating. We'll continue NG tube drainage. The patient was instructed to not eat any more ice until she was evaluated  by the general surgeon. Continue IV fluid hydration, IV Protonix, scheduled Zofran, and as needed analgesics. Continue sliding scale NovoLog and Lantus for treatment of diabetes. Continue Catapres patch for treatment of hypertension. Will await general surgery consultation and recommendations. Continue supportive treatment.  Plan:  As above.   LOS: 1 day   Cherell Colvin 05/23/2012, 10:45 AM

## 2012-05-23 NOTE — Consult Note (Signed)
Reason for Consult: Small bowel obstruction Referring Physician: [facility] hospital[facility]  Brandi Branch is an 52 y.o. female.  HPI: Patient presented to [facility] with increasing nausea and diffuse abdominal pain. She actually had similar symptomatology about a month ago he was suspected to have a small bowel obstruction at that time which responded to conservative management. She's had one episode of emesis during her emergency room stay. This was nonbloody. Her last bowel movement was several days ago and was noted to be loose but without blood. No history of melena or hematochezia. She has not had a previous colonoscopy. Patient denies any fevers but has had some subjective chills. Her appetite is diminished. She does have improvement of her symptomatology since being admitted and started on bowel rest with nasogastric decompression.  Past Medical History  Diagnosis Date  . Diabetes mellitus, type II   . Hypertension   . Hyperlipidemia   . Cervical cancer 2002    Status post hysterectomy and radiation therapy    Past Surgical History  Procedure Date  . Abdominal hysterectomy 2002  . Cesarean section     History reviewed. No pertinent family history.  Social History:  reports that she has never smoked. She does not have any smokeless tobacco history on file. She reports that she does not drink alcohol or use illicit drugs.  Allergies:  Allergies  Allergen Reactions  . Macrobid (Nitrofurantoin Macrocrystal)   . Sulfa Antibiotics     Medications:  I have reviewed the patient's current medications. Prior to Admission:  Prescriptions prior to admission  Medication Sig Dispense Refill  . cloNIDine (CATAPRES) 0.2 MG tablet Take 0.1 mg by mouth at bedtime.      . cyclobenzaprine (FLEXERIL) 10 MG tablet Take 10 mg by mouth at bedtime.      Marland Kitchen glipiZIDE (GLUCOTROL XL) 5 MG 24 hr tablet Take 5 mg by mouth 2 (two) times daily.      Marland Kitchen lisinopril (PRINIVIL,ZESTRIL) 20 MG tablet Take 20  mg by mouth daily.      . Multiple Vitamin (MULTIVITAMIN WITH MINERALS) TABS Take 1 tablet by mouth daily.      Marland Kitchen omeprazole (PRILOSEC) 20 MG capsule Take 20 mg by mouth daily.      . pravastatin (PRAVACHOL) 20 MG tablet Take 20 mg by mouth daily.      . sitaGLIPtan-metformin (JANUMET) 50-1000 MG per tablet Take 1 tablet by mouth at bedtime.       Marland Kitchen venlafaxine XR (EFFEXOR-XR) 150 MG 24 hr capsule Take 150 mg by mouth at bedtime.       Scheduled:   . cloNIDine  0.1 mg Transdermal Weekly  . heparin  5,000 Units Subcutaneous Q8H  . insulin aspart  0-9 Units Subcutaneous Q4H  . insulin glargine  5 Units Subcutaneous QHS  . ondansetron (ZOFRAN) IV  4 mg Intravenous Q6H  . pantoprazole (PROTONIX) IV  40 mg Intravenous Daily   Continuous:   . 0.9 % NaCl with KCl 40 mEq / L 100 mL/hr at 05/23/12 1718   NFA:OZHYQMVHQIONG, acetaminophen, albuterol, hydrALAZINE, HYDROmorphone (DILAUDID) injection, phenol, promethazine Anti-infectives    None      Results for orders placed during the hospital encounter of 05/22/12 (from the past 48 hour(s))  URINALYSIS, ROUTINE W REFLEX MICROSCOPIC     Status: Abnormal   Collection Time   05/22/12  6:08 AM      Component Value Range Comment   Color, Urine YELLOW  YELLOW    APPearance  CLEAR  CLEAR    Specific Gravity, Urine >1.030 (*) 1.005 - 1.030    pH 5.5  5.0 - 8.0    Glucose, UA NEGATIVE  NEGATIVE mg/dL    Hgb urine dipstick NEGATIVE  NEGATIVE    Bilirubin Urine NEGATIVE  NEGATIVE    Ketones, ur 15 (*) NEGATIVE mg/dL    Protein, ur NEGATIVE  NEGATIVE mg/dL    Urobilinogen, UA 0.2  0.0 - 1.0 mg/dL    Nitrite NEGATIVE  NEGATIVE    Leukocytes, UA NEGATIVE  NEGATIVE MICROSCOPIC NOT DONE ON URINES WITH NEGATIVE PROTEIN, BLOOD, LEUKOCYTES, NITRITE, OR GLUCOSE <1000 mg/dL.  CBC WITH DIFFERENTIAL     Status: Abnormal   Collection Time   05/22/12  6:09 AM      Component Value Range Comment   WBC 15.9 (*) 4.0 - 10.5 K/uL    RBC 5.38 (*) 3.87 - 5.11  MIL/uL    Hemoglobin 14.9  12.0 - 15.0 g/dL    HCT 28.4  13.2 - 44.0 %    MCV 81.6  78.0 - 100.0 fL    MCH 27.7  26.0 - 34.0 pg    MCHC 33.9  30.0 - 36.0 g/dL    RDW 10.2  72.5 - 36.6 %    Platelets 336  150 - 400 K/uL    Neutrophils Relative 71  43 - 77 %    Neutro Abs 11.4 (*) 1.7 - 7.7 K/uL    Lymphocytes Relative 21  12 - 46 %    Lymphs Abs 3.3  0.7 - 4.0 K/uL    Monocytes Relative 6  3 - 12 %    Monocytes Absolute 1.0  0.1 - 1.0 K/uL    Eosinophils Relative 1  0 - 5 %    Eosinophils Absolute 0.2  0.0 - 0.7 K/uL    Basophils Relative 0  0 - 1 %    Basophils Absolute 0.0  0.0 - 0.1 K/uL   COMPREHENSIVE METABOLIC PANEL     Status: Abnormal   Collection Time   05/22/12  6:09 AM      Component Value Range Comment   Sodium 139  135 - 145 mEq/L    Potassium 3.8  3.5 - 5.1 mEq/L    Chloride 98  96 - 112 mEq/L    CO2 27  19 - 32 mEq/L    Glucose, Bld 295 (*) 70 - 99 mg/dL    BUN 11  6 - 23 mg/dL    Creatinine, Ser 4.40  0.50 - 1.10 mg/dL    Calcium 34.7 (*) 8.4 - 10.5 mg/dL    Total Protein 8.2  6.0 - 8.3 g/dL    Albumin 4.5  3.5 - 5.2 g/dL    AST 27  0 - 37 U/L    ALT 35  0 - 35 U/L    Alkaline Phosphatase 104  39 - 117 U/L    Total Bilirubin 0.6  0.3 - 1.2 mg/dL    GFR calc non Af Amer >90  >90 mL/min    GFR calc Af Amer >90  >90 mL/min   GLUCOSE, CAPILLARY     Status: Abnormal   Collection Time   05/22/12 10:16 AM      Component Value Range Comment   Glucose-Capillary 254 (*) 70 - 99 mg/dL   GLUCOSE, CAPILLARY     Status: Abnormal   Collection Time   05/22/12 12:12 PM      Component Value Range Comment  Glucose-Capillary 201 (*) 70 - 99 mg/dL   HEMOGLOBIN Z3Y     Status: Abnormal   Collection Time   05/22/12  1:19 PM      Component Value Range Comment   Hemoglobin A1C 7.8 (*) <5.7 %    Mean Plasma Glucose 177 (*) <117 mg/dL   GLUCOSE, CAPILLARY     Status: Abnormal   Collection Time   05/22/12  4:32 PM      Component Value Range Comment   Glucose-Capillary 184 (*)  70 - 99 mg/dL   GLUCOSE, CAPILLARY     Status: Abnormal   Collection Time   05/22/12  8:46 PM      Component Value Range Comment   Glucose-Capillary 119 (*) 70 - 99 mg/dL   GLUCOSE, CAPILLARY     Status: Abnormal   Collection Time   05/22/12 11:22 PM      Component Value Range Comment   Glucose-Capillary 129 (*) 70 - 99 mg/dL    Comment 1 Notify RN     GLUCOSE, CAPILLARY     Status: Abnormal   Collection Time   05/23/12  3:13 AM      Component Value Range Comment   Glucose-Capillary 145 (*) 70 - 99 mg/dL    Comment 1 Notify RN     TSH     Status: Normal   Collection Time   05/23/12  5:58 AM      Component Value Range Comment   TSH 0.989  0.350 - 4.500 uIU/mL   COMPREHENSIVE METABOLIC PANEL     Status: Abnormal   Collection Time   05/23/12  5:58 AM      Component Value Range Comment   Sodium 140  135 - 145 mEq/L    Potassium 3.9  3.5 - 5.1 mEq/L    Chloride 105  96 - 112 mEq/L    CO2 29  19 - 32 mEq/L    Glucose, Bld 151 (*) 70 - 99 mg/dL    BUN 6  6 - 23 mg/dL    Creatinine, Ser 8.65  0.50 - 1.10 mg/dL    Calcium 9.1  8.4 - 78.4 mg/dL    Total Protein 6.1  6.0 - 8.3 g/dL    Albumin 3.1 (*) 3.5 - 5.2 g/dL    AST 15  0 - 37 U/L    ALT 22  0 - 35 U/L    Alkaline Phosphatase 72  39 - 117 U/L    Total Bilirubin 0.7  0.3 - 1.2 mg/dL    GFR calc non Af Amer >90  >90 mL/min    GFR calc Af Amer >90  >90 mL/min   GLUCOSE, CAPILLARY     Status: Abnormal   Collection Time   05/23/12  7:22 AM      Component Value Range Comment   Glucose-Capillary 172 (*) 70 - 99 mg/dL   GLUCOSE, CAPILLARY     Status: Abnormal   Collection Time   05/23/12 11:35 AM      Component Value Range Comment   Glucose-Capillary 155 (*) 70 - 99 mg/dL    Comment 1 Notify RN     GLUCOSE, CAPILLARY     Status: Abnormal   Collection Time   05/23/12  4:15 PM      Component Value Range Comment   Glucose-Capillary 166 (*) 70 - 99 mg/dL    Comment 1 Notify RN      Comment 2 Documented in Chart  Dg Abd 1  View  05/22/2012  *RADIOLOGY REPORT*  Clinical Data: Small bowel obstruction, follow-up, past history of cervical cancer post hysterectomy, radiation therapy, hypertension, diabetes  ABDOMEN - 1 VIEW  Comparison: CT abdomen and pelvis 05/22/2012  Findings: Tip of nasogastric tube projects over gastric antrum. Numerous surgical clips in pelvis bilaterally. Persistent dilatation of small bowel loops in mid abdomen compatible with small bowel obstruction. Minimal bowel wall thickening of the dilated small bowel loops. Small amount of gas and stool is seen throughout the proximal half of the colon. Bones demineralized. No urinary tract calcification.  IMPRESSION: Persistent dilatation of small bowel loops compatible with small bowel obstruction. Minimal bowel wall thickening of the dilated loops in the mid abdomen, nonspecific.   Original Report Authenticated By: Ulyses Southward, M.D.    Ct Abdomen Pelvis W Contrast  05/22/2012  *RADIOLOGY REPORT*  Clinical Data: Right upper quadrant pain with nausea and vomiting.  CT ABDOMEN AND PELVIS WITH CONTRAST  Technique:  Multidetector CT imaging of the abdomen and pelvis was performed following the standard protocol during bolus administration of intravenous contrast.  Contrast: OMNIPAQUE IOHEXOL 300 MG/ML  SOLN  Comparison: None.  Findings: Lung bases show dependent atelectasis with scattered scarring.  Heart size normal.  No pericardial or pleural effusion.  Liver is decreased in attenuation diffusely.  Liver, gallbladder, adrenal glands, kidneys, spleen, pancreas, stomach and proximal small bowel are otherwise unremarkable.  Mid and distal small bowel are fluid filled and dilated with gradual transition to normal caliber small bowel in the anatomic pelvis (images 70-82).  There is a small bowel feces sign, which is somewhat nonspecific in this setting.  Surgical clips are seen in the region of the cecum. Colon is unremarkable.  Surgical clips are seen along the iliac  chains and pelvic sidewalls.  Small pelvic free fluid.  Atherosclerotic calcification of the arterial vasculature without abdominal aortic aneurysm.  No pathologically enlarged lymph nodes.  Mild laxity is seen in the ventral midline abdominal wall, without overt hernia.  No worrisome lytic or sclerotic lesions. A bone island is seen in the L5 vertebral body.  IMPRESSION:  1.  Small bowel obstruction with gradual transition to normal caliber small bowel in the distal ileum. 2.  Hepatic steatosis. 3.  Small pelvic free fluid.   Original Report Authenticated By: Leanna Battles, M.D.     Review of Systems  Constitutional: Positive for chills. Negative for fever, weight loss, malaise/fatigue and diaphoresis.  HENT: Negative.   Eyes: Negative.   Respiratory: Negative.   Cardiovascular: Negative.   Gastrointestinal: Positive for heartburn, nausea, vomiting, abdominal pain ( diffuse colicky) and diarrhea. Negative for constipation, blood in stool and melena.  Genitourinary: Negative.   Musculoskeletal: Negative.   Skin: Negative.   Neurological: Negative.  Negative for weakness.  Endo/Heme/Allergies: Negative.   Psychiatric/Behavioral: Negative.    Blood pressure 137/65, pulse 106, temperature 98 F (36.7 C), temperature source Oral, resp. rate 20, height 5\' 5"  (1.651 m), weight 81.647 kg (180 lb), SpO2 96.00%. Physical Exam  Constitutional: She is oriented to person, place, and time. She appears well-developed and well-nourished. No distress.  HENT:  Head: Normocephalic and atraumatic.  Eyes: Conjunctivae normal and EOM are normal. Pupils are equal, round, and reactive to light. No scleral icterus.  Neck: Normal range of motion. Neck supple. No tracheal deviation present. No thyromegaly present.  Cardiovascular: Normal rate, regular rhythm and normal heart sounds.   Respiratory: Effort normal and breath sounds normal. No respiratory  distress.  GI: Soft. She exhibits distension. She exhibits no  mass. There is tenderness (mild to moderate diffuse tenderness). There is no rebound and no guarding.  Musculoskeletal: Normal range of motion.  Lymphadenopathy:    She has no cervical adenopathy.  Neurological: She is alert and oriented to person, place, and time.  Skin: Skin is warm.    Assessment/Plan: Small bowel obstruction. At this time a continue conservative management with continued n.p.o., nasogastric decompression, DVT prophylaxis, IV fluid hydration, and moderate IV analgesia. Indications for proceeding to the operating room were discussed with the patient including increased WBC count, increasing pain, increasing heart rate, increasing temperature or decreasing blood pressure despite conservative management. Additionally we discussed indications should she fail to progress. I do feel this is likely secondary to adhesions due to her pelvic surgery. Patient and family express understanding of current management course and are in agreement.  Eyden Dobie C 05/23/2012, 7:05 PM

## 2012-05-23 NOTE — Progress Notes (Signed)
Pt has ambulated in halls several times and tolerated well, pt also had a small formed BM and is passing gas.

## 2012-05-24 LAB — GLUCOSE, CAPILLARY
Glucose-Capillary: 118 mg/dL — ABNORMAL HIGH (ref 70–99)
Glucose-Capillary: 121 mg/dL — ABNORMAL HIGH (ref 70–99)
Glucose-Capillary: 131 mg/dL — ABNORMAL HIGH (ref 70–99)
Glucose-Capillary: 131 mg/dL — ABNORMAL HIGH (ref 70–99)
Glucose-Capillary: 142 mg/dL — ABNORMAL HIGH (ref 70–99)

## 2012-05-24 LAB — BASIC METABOLIC PANEL
CO2: 26 mEq/L (ref 19–32)
Calcium: 8.9 mg/dL (ref 8.4–10.5)
GFR calc non Af Amer: >90 mL/min (ref >90)
Glucose, Bld: 138 mg/dL — ABNORMAL HIGH (ref 70–99)
Potassium: 3.6 mEq/L (ref 3.5–5.1)
Sodium: 137 mEq/L (ref 135–145)

## 2012-05-24 LAB — CBC
Hemoglobin: 12.3 g/dL (ref 12.0–15.0)
Platelets: 228 10*3/uL (ref 150–400)
RBC: 4.43 MIL/uL (ref 3.87–5.11)

## 2012-05-24 MED ORDER — ACETAMINOPHEN 10 MG/ML IV SOLN
1000.0000 mg | Freq: Four times a day (QID) | INTRAVENOUS | Status: AC
  Administered 2012-05-24 (×4): 1000 mg via INTRAVENOUS
  Filled 2012-05-24 (×4): qty 100

## 2012-05-24 MED ORDER — INSULIN ASPART 100 UNIT/ML ~~LOC~~ SOLN
0.0000 [IU] | SUBCUTANEOUS | Status: DC
  Administered 2012-05-24 (×3): 1 [IU] via SUBCUTANEOUS

## 2012-05-24 MED ORDER — ACETAMINOPHEN 10 MG/ML IV SOLN
1000.0000 mg | Freq: Four times a day (QID) | INTRAVENOUS | Status: AC
  Administered 2012-05-25 (×4): 1000 mg via INTRAVENOUS
  Filled 2012-05-24 (×4): qty 100

## 2012-05-24 MED ORDER — ACETAMINOPHEN 10 MG/ML IV SOLN
INTRAVENOUS | Status: AC
  Filled 2012-05-24: qty 100

## 2012-05-24 MED ORDER — ACETAMINOPHEN 10 MG/ML IV SOLN
INTRAVENOUS | Status: AC
  Filled 2012-05-24: qty 200

## 2012-05-24 NOTE — Progress Notes (Signed)
  Subjective: Nausea improved.  BM this AM.  No abdominal pain.  Objective: Vital signs in last 24 hours: Temp:  [97.4 F (36.3 C)-98 F (36.7 C)] 97.4 F (36.3 C) (01/15 1413) Pulse Rate:  [94-106] 94  (01/15 1413) Resp:  [20] 20  (01/15 1413) BP: (110-147)/(63-85) 147/85 mmHg (01/15 1413) SpO2:  [92 %-98 %] 98 % (01/15 1413) Last BM Date: 05/23/12  Intake/Output from previous day: 01/14 0701 - 01/15 0700 In: 2710.3 [P.O.:100; I.V.:2398.3; IV Piggyback:212] Out: 400 [Emesis/NG output:400] Intake/Output this shift: Total I/O In: -  Out: 250 [Emesis/NG output:250]  General appearance: alert and no distress GI: +bs, soft.  Nontender.    Lab Results:   Basename 05/24/12 1007 05/22/12 0609  WBC 6.2 15.9*  HGB 12.3 14.9  HCT 37.3 43.9  PLT 228 336   BMET  Basename 05/24/12 1007 05/23/12 0558  NA 137 140  K 3.6 3.9  CL 102 105  CO2 26 29  GLUCOSE 138* 151*  BUN 5* 6  CREATININE 0.48* 0.50  CALCIUM 8.9 9.1   PT/INR No results found for this basename: LABPROT:2,INR:2 in the last 72 hours ABG No results found for this basename: PHART:2,PCO2:2,PO2:2,HCO3:2 in the last 72 hours  Studies/Results: No results found.  Anti-infectives: Anti-infectives    None      Assessment/Plan: s/p * No surgery found * SBO.  Appears to be resolving.  Agree with clamping NG.  If tolerates 6 hours, check resideual and if less than my d/c.    LOS: 2 days    Shar Paez C 05/24/2012

## 2012-05-24 NOTE — Progress Notes (Signed)
Subjective: The patient had a medium-sized bowel movement last night. She is also passed gas. She ambulated in the hallway this morning. She continues to have periumbilical abdominal pain, but much less.  Objective: Vital signs in last 24 hours: Filed Vitals:   05/23/12 1546 05/23/12 1903 05/23/12 2140 05/24/12 0608  BP:  137/65 117/72 110/63  Pulse: 100 106 103 97  Temp:  98 F (36.7 C) 97.9 F (36.6 C) 97.8 F (36.6 C)  TempSrc:  Oral Oral   Resp: 18 20 20 20   Height:      Weight:      SpO2: 97% 96% 96% 92%    Intake/Output Summary (Last 24 hours) at 05/24/12 1007 Last data filed at 05/24/12 0600  Gross per 24 hour  Intake 2710.33 ml  Output    400 ml  Net 2310.33 ml    Weight change:   Physical exam: General: Alert 52 year old Caucasian woman laying in bed, in no acute distress. Heart: S1, S2, with borderline tachycardia. Lungs: Clear to auscultation bilaterally. Abdomen: Positive bowel sounds, softer, minimally tender around the umbilicus, no distention. NG drainage output 400 cc overnight; draining light bilious fluid. Extremities: No pedal edema.    Lab Results: Basic Metabolic Panel:  Basename 05/23/12 0558 05/22/12 0609  NA 140 139  K 3.9 3.8  CL 105 98  CO2 29 27  GLUCOSE 151* 295*  BUN 6 11  CREATININE 0.50 0.51  CALCIUM 9.1 11.1*  MG -- --  PHOS -- --   Liver Function Tests:  Basename 05/23/12 0558 05/22/12 0609  AST 15 27  ALT 22 35  ALKPHOS 72 104  BILITOT 0.7 0.6  PROT 6.1 8.2  ALBUMIN 3.1* 4.5   No results found for this basename: LIPASE:2,AMYLASE:2 in the last 72 hours No results found for this basename: AMMONIA:2 in the last 72 hours CBC:  Basename 05/22/12 0609  WBC 15.9*  NEUTROABS 11.4*  HGB 14.9  HCT 43.9  MCV 81.6  PLT 336   Cardiac Enzymes: No results found for this basename: CKTOTAL:3,CKMB:3,CKMBINDEX:3,TROPONINI:3 in the last 72 hours BNP: No results found for this basename: PROBNP:3 in the last 72  hours D-Dimer: No results found for this basename: DDIMER:2 in the last 72 hours CBG:  Basename 05/24/12 0743 05/24/12 0303 05/24/12 0034 05/23/12 2055 05/23/12 1615 05/23/12 1135  GLUCAP 131* 129* 118* 108* 166* 155*   Hemoglobin A1C:  Basename 05/22/12 1319  HGBA1C 7.8*   Fasting Lipid Panel: No results found for this basename: CHOL,HDL,LDLCALC,TRIG,CHOLHDL,LDLDIRECT in the last 72 hours Thyroid Function Tests:  Basename 05/23/12 0558  TSH 0.989  T4TOTAL --  FREET4 --  T3FREE --  THYROIDAB --   Anemia Panel: No results found for this basename: VITAMINB12,FOLATE,FERRITIN,TIBC,IRON,RETICCTPCT in the last 72 hours Coagulation: No results found for this basename: LABPROT:2,INR:2 in the last 72 hours Urine Drug Screen: Drugs of Abuse  No results found for this basename: labopia,  cocainscrnur,  labbenz,  amphetmu,  thcu,  labbarb    Alcohol Level: No results found for this basename: ETH:2 in the last 72 hours Urinalysis:  Basename 05/22/12 0608  COLORURINE YELLOW  LABSPEC >1.030*  PHURINE 5.5  GLUCOSEU NEGATIVE  HGBUR NEGATIVE  BILIRUBINUR NEGATIVE  KETONESUR 15*  PROTEINUR NEGATIVE  UROBILINOGEN 0.2  NITRITE NEGATIVE  LEUKOCYTESUR NEGATIVE   Misc. Labs:   Micro: No results found for this or any previous visit (from the past 240 hour(s)).  Studies/Results: Dg Abd 1 View  05/22/2012  *RADIOLOGY REPORT*  Clinical  Data: Small bowel obstruction, follow-up, past history of cervical cancer post hysterectomy, radiation therapy, hypertension, diabetes  ABDOMEN - 1 VIEW  Comparison: CT abdomen and pelvis 05/22/2012  Findings: Tip of nasogastric tube projects over gastric antrum. Numerous surgical clips in pelvis bilaterally. Persistent dilatation of small bowel loops in mid abdomen compatible with small bowel obstruction. Minimal bowel wall thickening of the dilated small bowel loops. Small amount of gas and stool is seen throughout the proximal half of the colon. Bones  demineralized. No urinary tract calcification.  IMPRESSION: Persistent dilatation of small bowel loops compatible with small bowel obstruction. Minimal bowel wall thickening of the dilated loops in the mid abdomen, nonspecific.   Original Report Authenticated By: Ulyses Southward, M.D.     Medications:  Scheduled:    . acetaminophen  1,000 mg Intravenous Q6H  . cloNIDine  0.1 mg Transdermal Weekly  . heparin  5,000 Units Subcutaneous Q8H  . insulin aspart  0-9 Units Subcutaneous Q4H  . insulin glargine  5 Units Subcutaneous QHS  . ondansetron (ZOFRAN) IV  4 mg Intravenous Q6H  . pantoprazole (PROTONIX) IV  40 mg Intravenous Daily   Continuous:    . 0.9 % NaCl with KCl 40 mEq / L 100 mL/hr at 05/23/12 1718   WUJ:[id number], acetaminophen, albuterol, hydrALAZINE, HYDROmorphone (DILAUDID) injection, phenol, promethazine  Assessment: Active Problems:  Small bowel obstruction  Hypercalcemia  Leukocytosis  Hepatic steatosis  Diabetes mellitus, type 2  Hypertension  Dehydration   1. Small bowel obstruction with a history of abdominal surgeries. Likely secondary to adhesions. NG drainage has decreased. She has had a bowel movement. Will try clamping her NG and starting sips of clear liquids. Dr. Illene Regulus assessment is noted and appreciated.  Diabetes mellitus, type II. Her hemoglobin A1c is 7.8. Her capillary blood glucose is controlled on Lantus and sliding-scale NovoLog. We will titrate down the insulin to sensitive scale and discontinue the Lantus as her capillary blood glucose is trending downward. If she doesn't tolerate sips of clear liquids, would start adding dextrose to her IV fluids.  Hypertension. Well controlled with the Catapres patch.   Hypercalcemia. Secondary to dehydration. Resolved. Her TSH is within normal limits at 0.9.  Leukocytosis. Likely secondary to small bowel obstruction. She is afebrile. Followup CBC pending.  Plan:  1. Discontinue Lantus and change  sliding-scale NovoLog to sensitive scale. 2. Trial of clamping NG and starting sips of clear liquids. If she does not tolerate sips of clear liquids, would change IV fluids to add dextrose. 3. Check the results of the CBC and basic metabolic panel pending for this morning. 4. Continue ambulation as tolerated.   LOS: 2 days   Antigone Crowell 05/24/2012, 10:07 AM

## 2012-05-24 NOTE — Progress Notes (Signed)
NG tube clamped off for 6 hours. green bile output when residual checked. NG to left nare continues at low-intermittent suction.

## 2012-05-25 LAB — BASIC METABOLIC PANEL
CO2: 23 mEq/L (ref 19–32)
Calcium: 8.7 mg/dL (ref 8.4–10.5)
Creatinine, Ser: 0.45 mg/dL — ABNORMAL LOW (ref 0.50–1.10)
GFR calc Af Amer: >90 mL/min (ref >90)
GFR calc non Af Amer: >90 mL/min (ref >90)
Sodium: 137 mEq/L (ref 135–145)

## 2012-05-25 LAB — CBC
MCH: 27.2 pg (ref 26.0–34.0)
MCV: 82.9 fL (ref 78.0–100.0)
Platelets: 254 10*3/uL (ref 150–400)
RBC: 4.49 MIL/uL (ref 3.87–5.11)
RDW: 13.5 % (ref 11.5–15.5)

## 2012-05-25 LAB — GLUCOSE, CAPILLARY
Glucose-Capillary: 100 mg/dL — ABNORMAL HIGH (ref 70–99)
Glucose-Capillary: 98 mg/dL (ref 70–99)
Glucose-Capillary: 84 mg/dL (ref 70–99)
Glucose-Capillary: 95 mg/dL (ref 70–99)

## 2012-05-25 MED ORDER — INSULIN ASPART 100 UNIT/ML ~~LOC~~ SOLN
0.0000 [IU] | Freq: Three times a day (TID) | SUBCUTANEOUS | Status: DC
  Administered 2012-05-26 (×2): 2 [IU] via SUBCUTANEOUS

## 2012-05-25 MED ORDER — INSULIN ASPART 100 UNIT/ML ~~LOC~~ SOLN
4.0000 [IU] | Freq: Three times a day (TID) | SUBCUTANEOUS | Status: DC
  Administered 2012-05-26 (×2): 4 [IU] via SUBCUTANEOUS

## 2012-05-25 MED ORDER — CLONIDINE HCL 0.1 MG/24HR TD PTWK
0.1000 mg | MEDICATED_PATCH | TRANSDERMAL | Status: DC
  Administered 2012-05-25: 0.1 mg via TRANSDERMAL
  Filled 2012-05-25: qty 1

## 2012-05-25 MED ORDER — INSULIN ASPART 100 UNIT/ML ~~LOC~~ SOLN: 0.0000 [IU] | Freq: Every day | SUBCUTANEOUS | Status: DC

## 2012-05-25 NOTE — Progress Notes (Signed)
[facility]             Progress Note   Subjective: Patient is feeling better today.  Continued bowel movements and passing flatus, abdominal pain has improved.  Objective: Vital signs in last 24 hours: Temp:  [98 F (36.7 C)] 98 F (36.7 C) (01/16 0500) Pulse Rate:  [88-100] 88  (01/16 0808) Resp:  [16-20] 16  (01/16 0500) BP: (129-143)/(63-85) 129/63 mmHg (01/16 0500) SpO2:  [96 %-98 %] 96 % (01/16 0808) Weight change:  Last BM Date: 05/25/12  Intake/Output from previous day: 01/15 0701 - 01/16 0700 In: 1721 [I.V.:1521; IV Piggyback:200] Out: 750 [Urine:200; Emesis/NG output:550] Total I/O In: -  Out: 1102 [Urine:550; Emesis/NG output:550; Stool:2]   Physical Exam: General: Alert, awake, oriented x3, in no acute distress. HEENT: No bruits, no goiter. Heart: Regular rate and rhythm, without murmurs, rubs, gallops. Lungs: Clear to auscultation bilaterally. Abdomen: Soft, nontender, nondistended, positive bowel sounds. Extremities: No clubbing cyanosis or edema with positive pedal pulses. Neuro: Grossly intact, nonfocal.    Lab Results: Basic Metabolic Panel:  Basename 05/25/12 0409 05/24/12 1007  NA 137 137  K 3.7 3.6  CL 103 102  CO2 23 26  GLUCOSE 105* 138*  BUN 5* 5*  CREATININE 0.45* 0.48*  CALCIUM 8.7 8.9  MG -- --  PHOS -- --   Liver Function Tests:  [facility] 05/23/12 0558  AST 15  ALT 22  ALKPHOS 72  BILITOT 0.7  PROT 6.1  ALBUMIN 3.1*   No results found for this basename: LIPASE:2,AMYLASE:2 in the last 72 hours No results found for this basename: AMMONIA:2 in the last 72 hours CBC:  Basename 05/25/12 0409 05/24/12 1007  WBC 6.5 6.2  NEUTROABS -- --  HGB 12.2 12.3  HCT 37.2 37.3  MCV 82.9 84.2  PLT 254 228   Cardiac Enzymes: No results found for this basename: CKTOTAL:3,CKMB:3,CKMBINDEX:3,TROPONINI:3 in the last 72 hours BNP: No results found for this basename: PROBNP:3 in the last 72 hours D-Dimer: No results  found for this basename: DDIMER:2 in the last 72 hours CBG:  Basename 05/25/12 1132 05/25/12 0754 05/25/12 0346 05/25/12 0016 05/24/12 2026 05/24/12 1625  GLUCAP 98 100* 99 82 121* 142*   Hemoglobin A1C: No results found for this basename: HGBA1C in the last 72 hours Fasting Lipid Panel: No results found for this basename: CHOL,HDL,LDLCALC,TRIG,CHOLHDL,LDLDIRECT in the last 72 hours Thyroid Function Tests:  Basename 05/23/12 0558  TSH 0.989  T4TOTAL --  FREET4 --  T3FREE --  THYROIDAB --   Anemia Panel: No results found for this basename: VITAMINB12,FOLATE,FERRITIN,TIBC,IRON,RETICCTPCT in the last 72 hours Coagulation: No results found for this basename: LABPROT:2,INR:2 in the last 72 hours Urine Drug Screen: Drugs of Abuse  No results found for this basename: labopia, cocainscrnur, labbenz, amphetmu, thcu, labbarb    Alcohol Level: No results found for this basename: ETH:2 in the last 72 hours Urinalysis: No results found for this basename: COLORURINE:2,APPERANCEUR:2,LABSPEC:2,PHURINE:2,GLUCOSEU:2,HGBUR:2,BILIRUBINUR:2,KETONESUR:2,PROTEINUR:2,UROBILINOGEN:2,NITRITE:2,LEUKOCYTESUR:2 in the last 72 hours  No results found for this or any previous visit (from the past 240 hour(s)).  Studies/Results: No results found.  Medications: Scheduled Meds:   . acetaminophen  1,000 mg Intravenous Q6H  . cloNIDine  0.1 mg Transdermal Weekly  . heparin  5,000 Units Subcutaneous Q8H  . insulin aspart  0-9 Units Subcutaneous Q4H  . ondansetron (ZOFRAN) IV  4 mg Intravenous Q6H  . pantoprazole (PROTONIX) IV  40 mg Intravenous Daily   Continuous Infusions:   . 0.9 % NaCl with  KCl 40 mEq / L 100 mL/hr at 05/25/12 0620   PRN Meds:.acetaminophen, acetaminophen, albuterol, hydrALAZINE, HYDROmorphone (DILAUDID) injection, phenol, promethazine  Assessment/Plan:  Active Problems:  Small bowel obstruction  Hypercalcemia  Leukocytosis  Hepatic steatosis  Diabetes mellitus, type 2   Hypertension  Dehydration  1. Small bowel obstruction.   Patient had NG clamped today.  Will check residuals and if <200, will d/c NG tube and start clear liquids.  clinically she appears to be improving.  Appreciate surgery assistance.  2. Diabetes. Stable  3. Hypertension, controlled  Time spent coordinating care:   LOS: 3 days   MEMON,JEHANZEB [facility] Pager: [phone] 05/25/2012, 4:36 PM

## 2012-05-25 NOTE — Progress Notes (Signed)
  Subjective: Several bowel movements since yesterday. No nausea. No fevers or chills.  Objective: Vital signs in last 24 hours: Temp:  [98 F (36.7 C)-98.1 F (36.7 C)] 98.1 F (36.7 C) (01/16 2055) Pulse Rate:  [88-94] 94  (01/16 2055) Resp:  [16-17] 17  (01/16 2055) BP: (129-132)/(63-77) 132/77 mmHg (01/16 2055) SpO2:  [96 %-98 %] 98 % (01/16 2055) Last BM Date: 05/25/12  Intake/Output from previous day: 01/15 0701 - 01/16 0700 In: 1721 [I.V.:1521; IV Piggyback:200] Out: 750 [Urine:200; Emesis/NG output:550] Intake/Output this shift: Total I/O In: 300 [P.O.:300] Out: -   General appearance: alert and no distress GI: Intermittent bowel sounds, soft, flat, nontender.  Lab Results:   Basename 05/25/12 0409 05/24/12 1007  WBC 6.5 6.2  HGB 12.2 12.3  HCT 37.2 37.3  PLT 254 228   BMET  Basename 05/25/12 0409 05/24/12 1007  NA 137 137  K 3.7 3.6  CL 103 102  CO2 23 26  GLUCOSE 105* 138*  BUN 5* 5*  CREATININE 0.45* 0.48*  CALCIUM 8.7 8.9   PT/INR No results found for this basename: LABPROT:2,INR:2 in the last 72 hours ABG No results found for this basename: PHART:2,PCO2:2,PO2:2,HCO3:2 in the last 72 hours  Studies/Results: No results found.  Anti-infectives: Anti-infectives    None      Assessment/Plan: s/p * No surgery found * Small bowel obstruction. Again appears to have some progression. Nasogastric tube is again clamped and we'll again will check residuals. If less than 250 ML's nasogastric tube may be discontinued. Patient may have clear liquids. Slowly advance diet as tolerated  LOS: 3 days    Estella Malatesta C 05/25/2012

## 2012-05-26 MED ORDER — PANTOPRAZOLE SODIUM 40 MG PO TBEC
40.0000 mg | DELAYED_RELEASE_TABLET | Freq: Every day | ORAL | Status: DC
  Administered 2012-05-26: 40 mg via ORAL

## 2012-05-26 NOTE — Progress Notes (Signed)
IV removed, site WNL.  Pt given d/c instructions.  Discussed home care with patient and discussed home medications, patient verbalizes understanding, teachback completed. F/U appointment to be made by pt, pt states they will make and keep appointment. Pt is stable at this time. Pt taken to main entrance, pt stated she did not need wheelchair, ambulated with staff member.

## 2012-05-26 NOTE — Progress Notes (Signed)
UR Chart Review Completed  

## 2012-05-26 NOTE — Progress Notes (Signed)
  Subjective: Tolerating advancement of diet. Positive bowel movements. No abdominal symptomatology. No fevers or chills.  Objective: Vital signs in last 24 hours: Temp:  [97.7 F (36.5 C)-98.1 F (36.7 C)] 97.7 F (36.5 C) (01/17 0518) Pulse Rate:  [92-94] 92  (01/17 0518) Resp:  [16-17] 16  (01/17 0518) BP: (132-139)/(71-77) 139/71 mmHg (01/17 0518) SpO2:  [97 %-98 %] 97 % (01/17 0518) Last BM Date: 05/26/12  Intake/Output from previous day: 01/16 0701 - 01/17 0700 In: 2620 [P.O.:520; I.V.:2100] Out: 1102 [Urine:550; Emesis/NG output:550; Stool:2] Intake/Output this shift: Total I/O In: 360 [P.O.:360] Out: -   General appearance: alert and no distress GI: soft, non-tender; bowel sounds normal; no masses,  no organomegaly  Lab Results:   Basename 05/25/12 0409 05/24/12 1007  WBC 6.5 6.2  HGB 12.2 12.3  HCT 37.2 37.3  PLT 254 228   BMET  Basename 05/25/12 0409 05/24/12 1007  NA 137 137  K 3.7 3.6  CL 103 102  CO2 23 26  GLUCOSE 105* 138*  BUN 5* 5*  CREATININE 0.45* 0.48*  CALCIUM 8.7 8.9   PT/INR No results found for this basename: LABPROT:2,INR:2 in the last 72 hours ABG No results found for this basename: PHART:2,PCO2:2,PO2:2,HCO3:2 in the last 72 hours  Studies/Results: No results found.  Anti-infectives: Anti-infectives    None      Assessment/Plan: s/p * No surgery found * Small bowel obstruction now demonstrating improvement. Continue to advance diet. No acute surgical findings. If tolerates a regular diet may discharge from my standpoint.  LOS: 4 days    Katlyn Muldrew C 05/26/2012

## 2012-05-26 NOTE — Discharge Summary (Signed)
Physician Discharge Summary  Brandi Branch UJW:[id number] DOB: [birth year] DOA: 05/22/2012  PCP: Katherina Right, MD  Admit date: 05/22/2012 Discharge date: 05/26/2012  Time spent: 35 minutes  Recommendations for Outpatient Follow-up:  Follow up with primary care doctor in 2 weeks  Discharge Diagnoses:  Active Problems:  Small bowel obstruction  Hypercalcemia  Leukocytosis  Hepatic steatosis  Diabetes mellitus, type 2  Hypertension  Dehydration   Discharge Condition: improved  Diet recommendation: low salt, low carb  Filed Weights   05/22/12 0600 05/22/12 1315  Weight: 81.647 kg (180 lb) 81.647 kg (180 lb)    History of present illness:  Brandi Branch is a 52 y.o. female with a history significant for cervical cancer, status post hysterectomy, status post C-section, and diabetes mellitus, who presents to the emergency department today with a chief complaint of abdominal pain. Her pain started last night. It is located below her umbilicus. It has been intermittent but has progressed to constant. She describes the pain as a "knot". She also describes the pain as a sharp pain. It does not radiate. At its worse, its an 8/10 in intensity. She had one episode of vomiting which occurred in the emergency department this morning. No vomiting at home, but she has been persistently nauseated. Her last bowel movement was 2 days ago. She has intermittent loose stools and constipation chronically. She denies bright red blood in her emesis. She denies coffee grounds emesis. She denies black tarry stools and bright red blood per rectum. She has no prior history of small bowel obstruction. She denies associated fever and chills. She denies pain with urination.  In the emergency department, she is mildly tachycardic, afebrile, and with mild hypertension. Her lab data are significant for a glucose of 295, WBC of 15.9 and calcium of 11.1. CT of her abdomen and pelvis reveals a small bowel obstruction with  gradual transition to normal caliber of the small bowel in the distal ileum, hepatic steatosis, and small pelvic free fluid. She is being admitted for further evaluation and management.   Hospital Course:  This lady was admitted to the hospital for a small bowel obstruction. She has a history of prior abdominal surgeries.  She was treated conservatively with NG tube suction and was made NPO.  Fortunately, her obstruction responded to conservative treatment and surgical intervention was not required.  General surgery was consulted and followed along during hospital course. Eventually her NG tube was discontinued, and she is tolerating solid foods.  She is moving her bowels and passing flatus.  She has been cleared for discharge home today. Remainder of her medical issues remained stable.  Procedures:  none  Consultations:  General surgery, Dr. Leticia Penna  Discharge Exam: Filed Vitals:   05/25/12 0500 05/25/12 0808 05/25/12 2055 05/26/12 0518  BP: 129/63  132/77 139/71  Pulse: 93 88 94 92  Temp: 98 F (36.7 C)  98.1 F (36.7 C) 97.7 F (36.5 C)  TempSrc: Oral  Oral Oral  Resp: 16  17 16   Height:      Weight:      SpO2: 98% 96% 98% 97%    General: NAD Cardiovascular: S1, S2, RRR Respiratory: CTA B  Discharge Instructions  Discharge Orders    Future Orders Please Complete By Expires   Diet - low sodium heart healthy      Diet Carb Modified      Increase activity slowly      Call MD for:  temperature >100.4  Call MD for:  severe uncontrolled pain      Call MD for:  persistant nausea and vomiting          Medication List     As of 05/26/2012  3:02 PM    TAKE these medications         cloNIDine 0.2 MG tablet   Commonly known as: CATAPRES   Take 0.1 mg by mouth at bedtime.      cyclobenzaprine 10 MG tablet   Commonly known as: FLEXERIL   Take 10 mg by mouth at bedtime.      glipiZIDE 5 MG 24 hr tablet   Commonly known as: GLUCOTROL XL   Take 5 mg by mouth 2  (two) times daily.      lisinopril 20 MG tablet   Commonly known as: PRINIVIL,ZESTRIL   Take 20 mg by mouth daily.      multivitamin with minerals Tabs   Take 1 tablet by mouth daily.      omeprazole 20 MG capsule   Commonly known as: PRILOSEC   Take 20 mg by mouth daily.      pravastatin 20 MG tablet   Commonly known as: PRAVACHOL   Take 20 mg by mouth daily.      sitaGLIPtan-metformin 50-1000 MG per tablet   Commonly known as: JANUMET   Take 1 tablet by mouth at bedtime.      venlafaxine XR 150 MG 24 hr capsule   Commonly known as: EFFEXOR-XR   Take 150 mg by mouth at bedtime.           Follow-up Information    Follow up with Katherina Right, MD. Schedule an appointment as soon as possible for a visit in 2 weeks.   Contact information:   [address] [place] [address] [phone]           The results of significant diagnostics from this hospitalization (including imaging, microbiology, ancillary and laboratory) are listed below for reference.    Significant Diagnostic Studies: Dg Abd 1 View  05/22/2012  *RADIOLOGY REPORT*  Clinical Data: Small bowel obstruction, follow-up, past history of cervical cancer post hysterectomy, radiation therapy, hypertension, diabetes  ABDOMEN - 1 VIEW  Comparison: CT abdomen and pelvis 05/22/2012  Findings: Tip of nasogastric tube projects over gastric antrum. Numerous surgical clips in pelvis bilaterally. Persistent dilatation of small bowel loops in mid abdomen compatible with small bowel obstruction. Minimal bowel wall thickening of the dilated small bowel loops. Small amount of gas and stool is seen throughout the proximal half of the colon. Bones demineralized. No urinary tract calcification.  IMPRESSION: Persistent dilatation of small bowel loops compatible with small bowel obstruction. Minimal bowel wall thickening of the dilated loops in the mid abdomen, nonspecific.   Original Report Authenticated By: Ulyses Southward, M.D.     Ct Abdomen Pelvis W Contrast  05/22/2012  *RADIOLOGY REPORT*  Clinical Data: Right upper quadrant pain with nausea and vomiting.  CT ABDOMEN AND PELVIS WITH CONTRAST  Technique:  Multidetector CT imaging of the abdomen and pelvis was performed following the standard protocol during bolus administration of intravenous contrast.  Contrast: OMNIPAQUE IOHEXOL 300 MG/ML  SOLN  Comparison: None.  Findings: Lung bases show dependent atelectasis with scattered scarring.  Heart size normal.  No pericardial or pleural effusion.  Liver is decreased in attenuation diffusely.  Liver, gallbladder, adrenal glands, kidneys, spleen, pancreas, stomach and proximal small bowel are otherwise unremarkable.  Mid and distal small bowel  are fluid filled and dilated with gradual transition to normal caliber small bowel in the anatomic pelvis (images 70-82).  There is a small bowel feces sign, which is somewhat nonspecific in this setting.  Surgical clips are seen in the region of the cecum. Colon is unremarkable.  Surgical clips are seen along the iliac chains and pelvic sidewalls.  Small pelvic free fluid.  Atherosclerotic calcification of the arterial vasculature without abdominal aortic aneurysm.  No pathologically enlarged lymph nodes.  Mild laxity is seen in the ventral midline abdominal wall, without overt hernia.  No worrisome lytic or sclerotic lesions. A bone island is seen in the L5 vertebral body.  IMPRESSION:  1.  Small bowel obstruction with gradual transition to normal caliber small bowel in the distal ileum. 2.  Hepatic steatosis. 3.  Small pelvic free fluid.   Original Report Authenticated By: Leanna Battles, M.D.     Microbiology: No results found for this or any previous visit (from the past 240 hour(s)).   Labs: Basic Metabolic Panel:  Lab 05/25/12 1610 05/24/12 1007 05/23/12 0558 05/22/12 0609  NA 137 137 140 139  K 3.7 3.6 3.9 3.8  CL 103 102 105 98  CO2 23 26 29 27   GLUCOSE 105* 138* 151* 295*    BUN 5* 5* 6 11  CREATININE 0.45* 0.48* 0.50 0.51  CALCIUM 8.7 8.9 9.1 11.1*  MG -- -- -- --  PHOS -- -- -- --   Liver Function Tests:  Lab 05/23/12 0558 05/22/12 0609  AST 15 27  ALT 22 35  ALKPHOS 72 104  BILITOT 0.7 0.6  PROT 6.1 8.2  ALBUMIN 3.1* 4.5   No results found for this basename: LIPASE:5,AMYLASE:5 in the last 168 hours No results found for this basename: AMMONIA:5 in the last 168 hours CBC:  Lab 05/25/12 0409 05/24/12 1007 05/22/12 0609  WBC 6.5 6.2 15.9*  NEUTROABS -- -- 11.4*  HGB 12.2 12.3 14.9  HCT 37.2 37.3 43.9  MCV 82.9 84.2 81.6  PLT 254 228 336   Cardiac Enzymes: No results found for this basename: CKTOTAL:5,CKMB:5,CKMBINDEX:5,TROPONINI:5 in the last 168 hours BNP: BNP (last 3 results) No results found for this basename: PROBNP:3 in the last 8760 hours CBG:  Lab 05/26/12 1122 05/26/12 0755 05/25/12 1949 05/25/12 1653 05/25/12 1132  GLUCAP 130* 147* 95 84 98       Signed:  Arav Bannister  [facility] 05/26/2012, 3:02 PM

## 2013-11-04 IMAGING — CT CT ABD-PELV W/ CM
2 of 4 series · 16 of 46 positions shown, 18 images · IV contrast (Omnipaque 300)
Comparison: None.

CLINICAL DATA: Right upper quadrant pain with nausea and vomiting.

CT ABDOMEN AND PELVIS WITH CONTRAST
TECHNIQUE: Multidetector CT imaging of the abdomen and pelvis was
performed following the standard protocol during bolus
administration of intravenous contrast.
Contrast: 100mL OMNIPAQUE IOHEXOL 300 MG/ML  SOLN

[Series 2: abd_pel_with 5.0 b40f · axial · 0.67mm/px · z∈[-444,+6]mm · 13 of 100 slices shown, 15 images]
[im 5/100  soft-tissue]
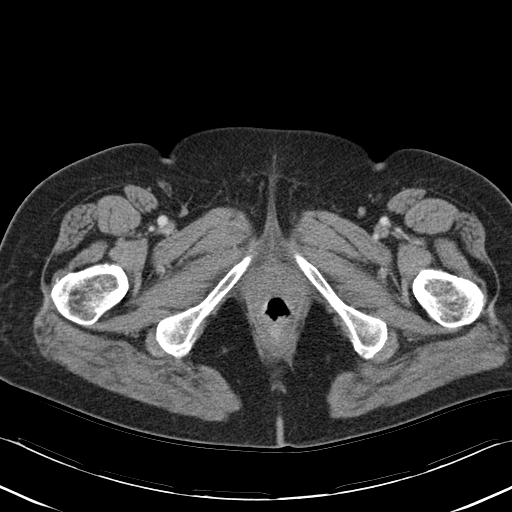
[im 5/100  bone]
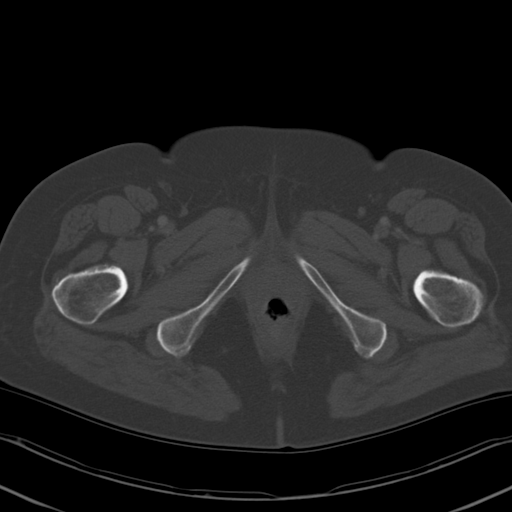
[im 14/100  soft-tissue]
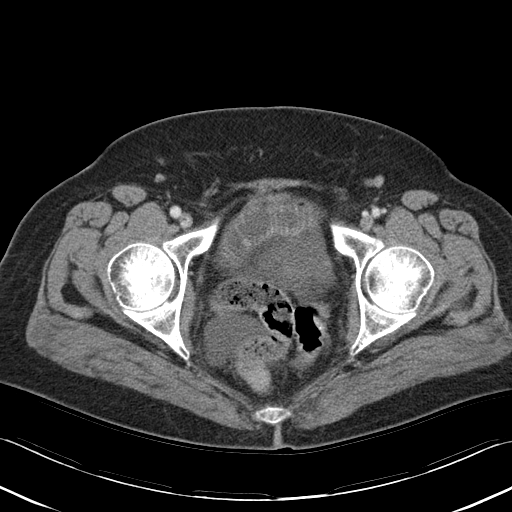
[im 23/100  soft-tissue]
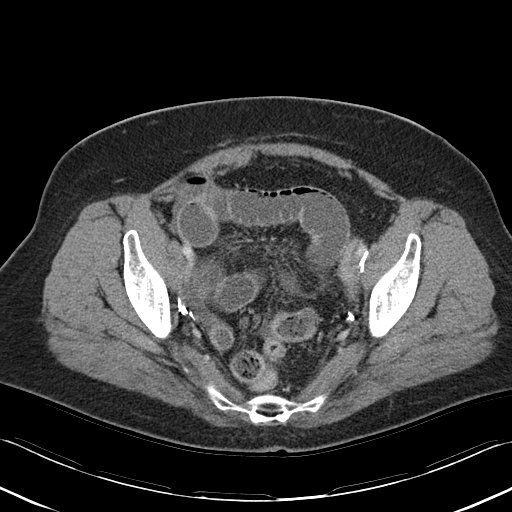
[im 28/100  soft-tissue]
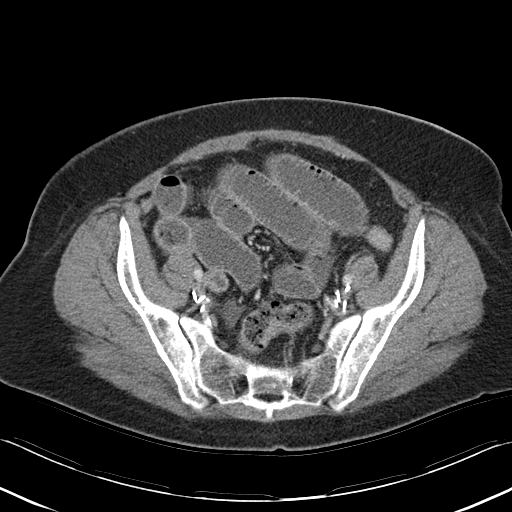
[im 37/100  soft-tissue]
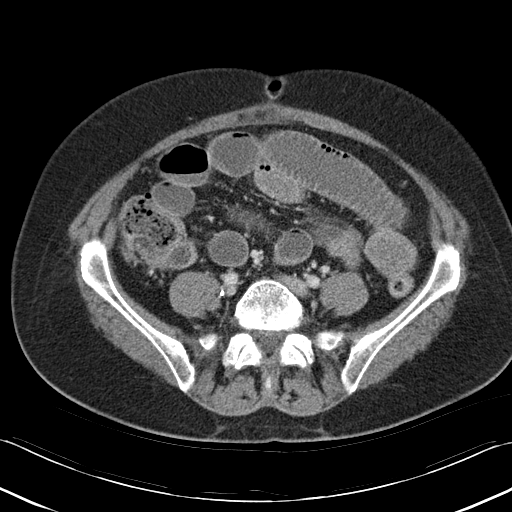
[im 41/100  soft-tissue]
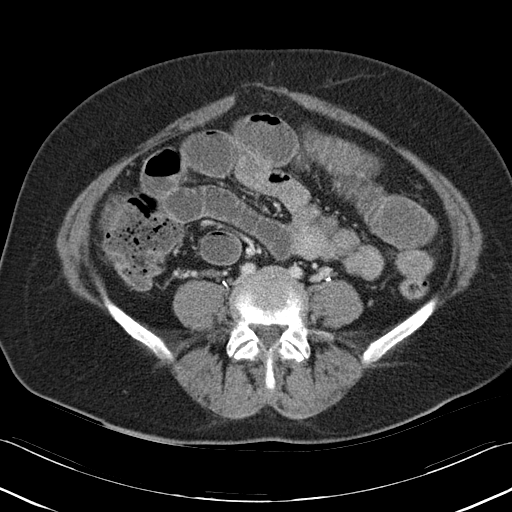
[im 50/100  soft-tissue]
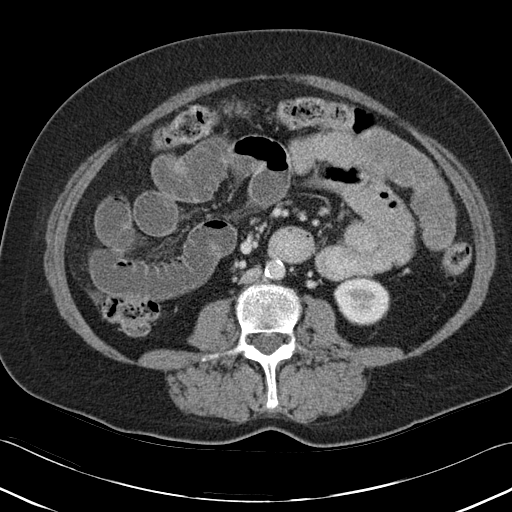
[im 59/100  soft-tissue]
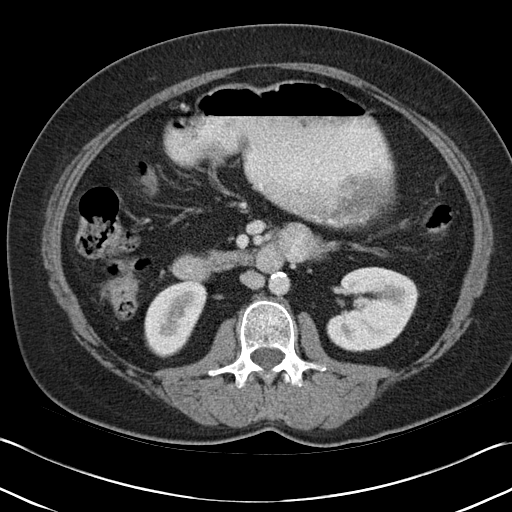
[im 64/100  soft-tissue]
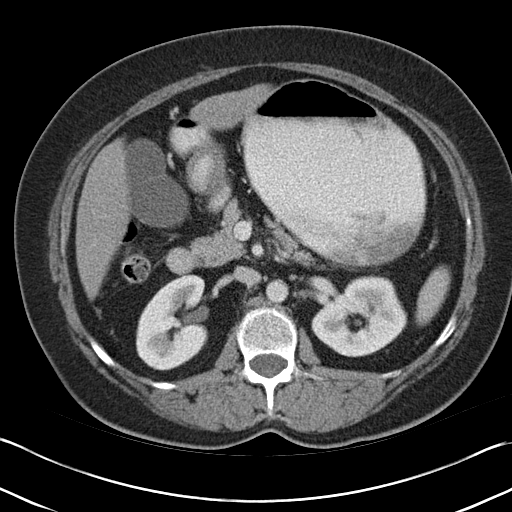
[im 64/100  bone]
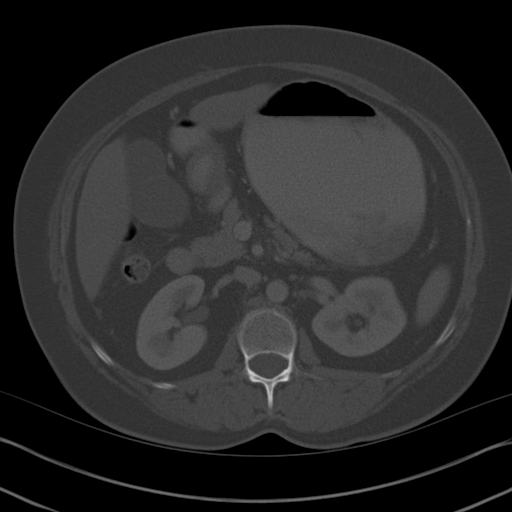
[im 73/100  soft-tissue]
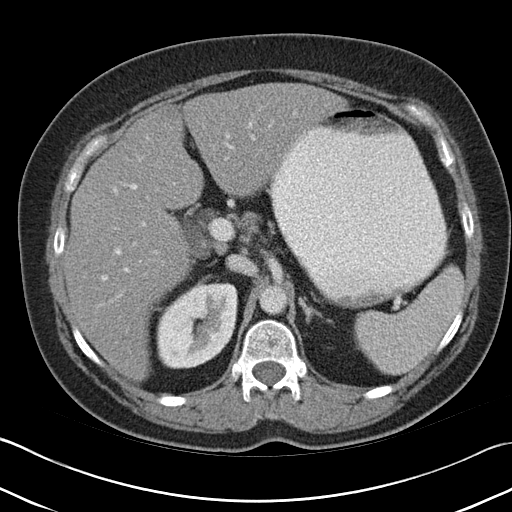
[im 77/100  soft-tissue]
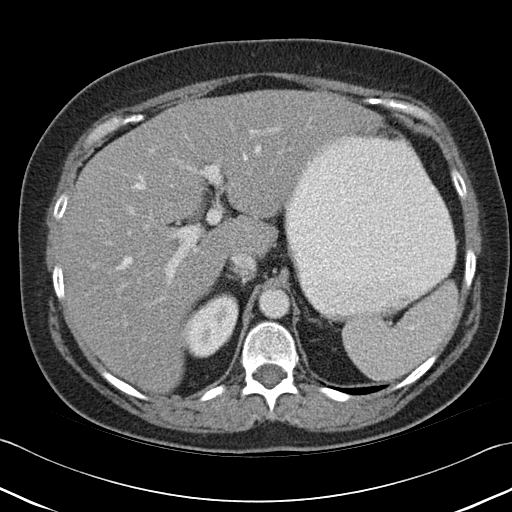
[im 86/100  soft-tissue]
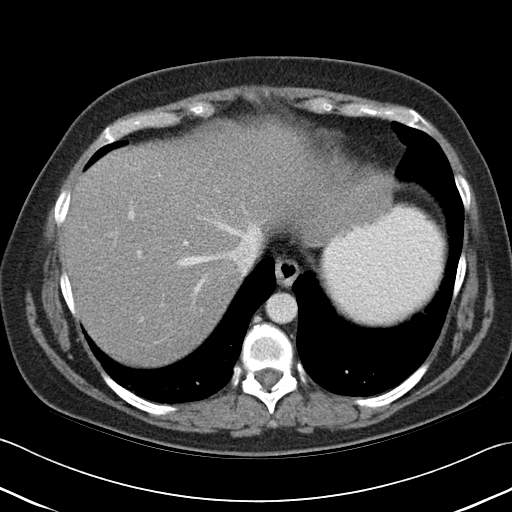
[im 95/100  soft-tissue]
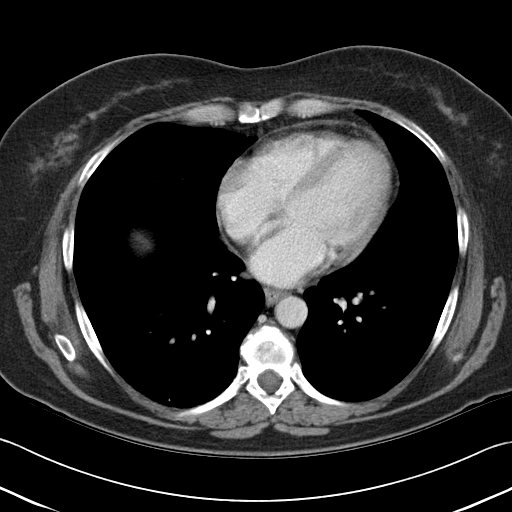

[Series 4: abd_pel_with 3.0 spo cor · coronal · 0.71mm/px · 3 of 82 slices shown]
[im 28/82  soft-tissue]
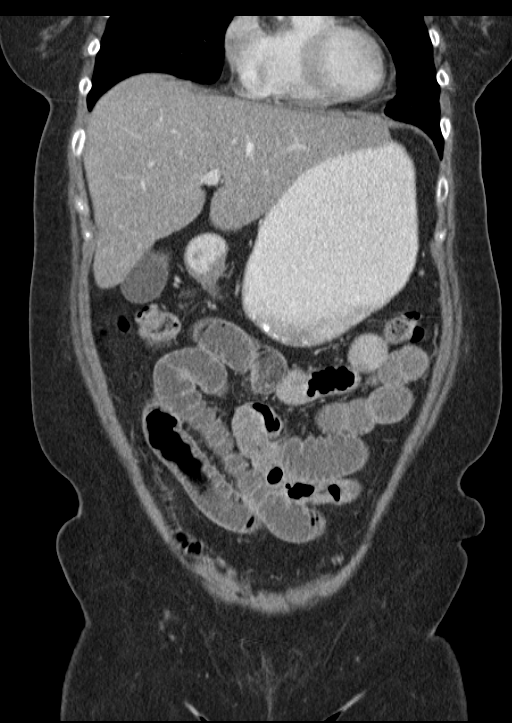
[im 37/82  soft-tissue]
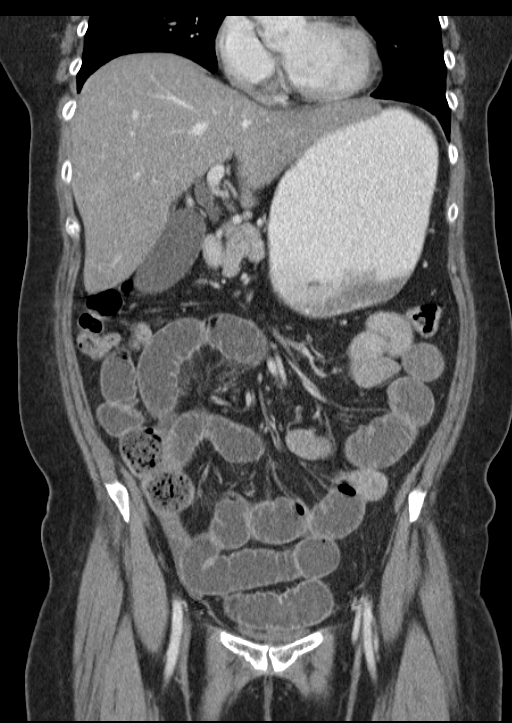
[im 46/82  soft-tissue]
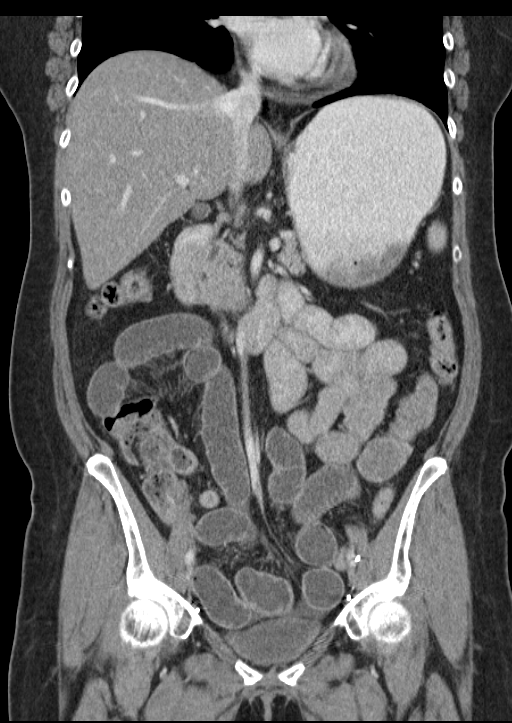

[16 of 46 positions shown; findings below may reference images not displayed]

FINDINGS: Lung bases show dependent atelectasis with scattered
scarring.  Heart size normal.  No pericardial or pleural effusion.

Liver is decreased in attenuation diffusely.  Liver, gallbladder,
adrenal glands, kidneys, spleen, pancreas, stomach and proximal
small bowel are otherwise unremarkable.  Mid and distal small bowel
are fluid filled and dilated with gradual transition to normal
caliber small bowel in the anatomic pelvis (images 70-82).  There
is a small bowel feces sign, which is somewhat nonspecific in this
setting.  Surgical clips are seen in the region of the cecum.
Colon is unremarkable.

Surgical clips are seen along the iliac chains and pelvic
sidewalls.  Small pelvic free fluid.  Atherosclerotic calcification
of the arterial vasculature without abdominal aortic aneurysm.  No
pathologically enlarged lymph nodes.  Mild laxity is seen in the
ventral midline abdominal wall, without overt hernia.

No worrisome lytic or sclerotic lesions. A bone island is seen in
the L5 vertebral body.
IMPRESSION: 1.  Small bowel obstruction with gradual transition to normal
caliber small bowel in the distal ileum.
2.  Hepatic steatosis.
3.  Small pelvic free fluid.

## 2020-05-02 ENCOUNTER — Encounter (HOSPITAL_COMMUNITY): Payer: Self-pay | Admitting: Emergency Medicine

## 2020-05-02 ENCOUNTER — Emergency Department (HOSPITAL_COMMUNITY)
Admission: EM | Admit: 2020-05-02 | Discharge: 2020-05-02 | Disposition: A | Discharge status: Home / Self Care | Payer: Medicaid Other | Attending: Emergency Medicine | Admitting: Emergency Medicine

## 2020-05-02 ENCOUNTER — Other Ambulatory Visit: Payer: Self-pay

## 2020-05-02 DIAGNOSIS — R000 Tachycardia, unspecified: Secondary | ICD-10-CM | POA: Insufficient documentation

## 2020-05-02 DIAGNOSIS — Z7984 Long term (current) use of oral hypoglycemic drugs: Secondary | ICD-10-CM | POA: Insufficient documentation

## 2020-05-02 DIAGNOSIS — Z8541 Personal history of malignant neoplasm of cervix uteri: Secondary | ICD-10-CM | POA: Diagnosis not present

## 2020-05-02 DIAGNOSIS — I10 Essential (primary) hypertension: Secondary | ICD-10-CM | POA: Diagnosis not present

## 2020-05-02 DIAGNOSIS — R059 Cough, unspecified: Secondary | ICD-10-CM | POA: Diagnosis present

## 2020-05-02 DIAGNOSIS — Z79899 Other long term (current) drug therapy: Secondary | ICD-10-CM | POA: Diagnosis not present

## 2020-05-02 DIAGNOSIS — E119 Type 2 diabetes mellitus without complications: Secondary | ICD-10-CM | POA: Diagnosis not present

## 2020-05-02 DIAGNOSIS — U071 COVID-19: Secondary | ICD-10-CM | POA: Diagnosis not present

## 2020-05-02 LAB — CBC WITH DIFFERENTIAL/PLATELET
Abs Immature Granulocytes: 0.01 10*3/uL (ref 0.00–0.07)
Basophils Absolute: 0 10*3/uL (ref 0.0–0.1)
Basophils Relative: 0 %
Eosinophils Absolute: 0.1 10*3/uL (ref 0.0–0.5)
Eosinophils Relative: 1 %
HCT: 38.5 % (ref 36.0–46.0)
Hemoglobin: 12.7 g/dL (ref 12.0–15.0)
Immature Granulocytes: 0 %
Lymphocytes Relative: 32 %
Lymphs Abs: 1.5 10*3/uL (ref 0.7–4.0)
MCH: 29.1 pg (ref 26.0–34.0)
MCHC: 33 g/dL (ref 30.0–36.0)
MCV: 88.3 fL (ref 80.0–100.0)
Monocytes Absolute: 0.5 10*3/uL (ref 0.1–1.0)
Monocytes Relative: 11 %
Neutro Abs: 2.6 10*3/uL (ref 1.7–7.7)
Neutrophils Relative %: 56 %
Platelets: 187 10*3/uL (ref 150–400)
RBC: 4.36 MIL/uL (ref 3.87–5.11)
RDW: 13.5 % (ref 11.5–15.5)
WBC: 4.8 10*3/uL (ref 4.0–10.5)
nRBC: 0 % (ref 0.0–0.2)

## 2020-05-02 LAB — BASIC METABOLIC PANEL
Anion gap: 9 (ref 5–15)
BUN: 8 mg/dL (ref 6–20)
CO2: 23 mmol/L (ref 22–32)
Calcium: 8.7 mg/dL — ABNORMAL LOW (ref 8.9–10.3)
Chloride: 101 mmol/L (ref 98–111)
Creatinine, Ser: 0.58 mg/dL (ref 0.44–1.00)
GFR, Estimated: >60 mL/min (ref >60)
Glucose, Bld: 244 mg/dL — ABNORMAL HIGH (ref 70–99)
Potassium: 3.4 mmol/L — ABNORMAL LOW (ref 3.5–5.1)
Sodium: 133 mmol/L — ABNORMAL LOW (ref 135–145)

## 2020-05-02 MED ORDER — SODIUM CHLORIDE 0.9 % IV SOLN
1200.0000 mg | Freq: Once | INTRAVENOUS | Status: AC
  Administered 2020-05-02: 1200 mg via INTRAVENOUS
  Filled 2020-05-02: qty 10

## 2020-05-02 MED ORDER — FAMOTIDINE IN NACL 20-0.9 MG/50ML-% IV SOLN: 20.0000 mg | Freq: Once | INTRAVENOUS | Status: DC | PRN

## 2020-05-02 MED ORDER — METHYLPREDNISOLONE SODIUM SUCC 125 MG IJ SOLR: 125.0000 mg | Freq: Once | INTRAMUSCULAR | Status: DC | PRN

## 2020-05-02 MED ORDER — EPINEPHRINE 0.3 MG/0.3ML IJ SOAJ: 0.3000 mg | Freq: Once | INTRAMUSCULAR | Status: DC | PRN

## 2020-05-02 MED ORDER — SODIUM CHLORIDE 0.9 % IV SOLN: INTRAVENOUS | Status: DC | PRN

## 2020-05-02 MED ORDER — ALBUTEROL SULFATE HFA 108 (90 BASE) MCG/ACT IN AERS
2.0000 | INHALATION_SPRAY | Freq: Once | RESPIRATORY_TRACT | Status: AC | PRN
  Administered 2020-05-02: 2 via RESPIRATORY_TRACT
  Filled 2020-05-02: qty 6.7

## 2020-05-02 MED ORDER — DIPHENHYDRAMINE HCL 50 MG/ML IJ SOLN: 50.0000 mg | Freq: Once | INTRAMUSCULAR | Status: DC | PRN

## 2020-05-02 NOTE — Discharge Instructions (Addendum)
You were seen in the emergency department for worsening Covid symptoms. Your blood work did not show any significant abnormalities and your oxygen level was stable. You received an infusion of monoclonal antibodies. You will need to isolate for 10 days from your symptoms start. Please follow-up with your primary care doctor. Return to the emergency department for any worsening or concerning symptoms.

## 2020-05-02 NOTE — ED Triage Notes (Signed)
Pt c/o covid positive test at home yesterday with sx that began on Wednesday. NADN

## 2020-05-02 NOTE — ED Provider Notes (Signed)
[facility] EMERGENCY DEPARTMENT Provider Note   CSN: [id number] Arrival date & time: 05/02/20  1046     History Chief Complaint  Patient presents with  . Covid Positive    Ernestene MentionCarla Mikus is a 59 y.o. female.  She has had Covid-like symptoms for 2 days.  Complaining of cough body aches diarrhea sore throat.  She had a positive home test yesterday.  She is here today requesting monoclonal antibodies.  She has not been vaccinated.  She is a non-smoker.  The history is provided by the patient.  Influenza Presenting symptoms: cough, diarrhea, fatigue, myalgias, shortness of breath and sore throat   Severity:  Moderate Onset quality:  Gradual Progression:  Unchanged Chronicity:  New Relieved by:  None tried Worsened by:  Nothing Ineffective treatments:  None tried Associated symptoms: nasal congestion   Associated symptoms: no mental status change   Risk factors: diabetes        Past Medical History:  Diagnosis Date  . Cervical cancer (HCC) 2002   Status post hysterectomy and radiation therapy  . Diabetes mellitus, type II (HCC)   . Hyperlipidemia   . Hypertension     Patient Active Problem List   Diagnosis Date Noted  . Small bowel obstruction (HCC) 05/22/2012  . Hypercalcemia 05/22/2012  . Leukocytosis 05/22/2012  . Hepatic steatosis 05/22/2012  . Diabetes mellitus, type 2 (HCC) 05/22/2012  . Hypertension 05/22/2012  . Dehydration 05/22/2012    Past Surgical History:  Procedure Laterality Date  . ABDOMINAL HYSTERECTOMY  2002  . CESAREAN SECTION       OB History   No obstetric history on file.     History reviewed. No pertinent family history.  Social History   Tobacco Use  . Smoking status: Never Smoker  . Smokeless tobacco: Never Used  Vaping Use  . Vaping Use: Never used  Substance Use Topics  . Alcohol use: No  . Drug use: No    Home Medications Prior to Admission medications   Medication Sig Start Date End Date Taking? Authorizing Provider   cloNIDine (CATAPRES) 0.2 MG tablet Take 0.1 mg by mouth at bedtime.    [provider]  cyclobenzaprine (FLEXERIL) 10 MG tablet Take 10 mg by mouth at bedtime.    [provider]  glipiZIDE (GLUCOTROL XL) 5 MG 24 hr tablet Take 5 mg by mouth 2 (two) times daily.    [provider]  lisinopril (PRINIVIL,ZESTRIL) 20 MG tablet Take 20 mg by mouth daily.    [provider]  Multiple Vitamin (MULTIVITAMIN WITH MINERALS) TABS Take 1 tablet by mouth daily.    [provider]  omeprazole (PRILOSEC) 20 MG capsule Take 20 mg by mouth daily.    [provider]  pravastatin (PRAVACHOL) 20 MG tablet Take 20 mg by mouth daily.    [provider]  sitaGLIPtan-metformin (JANUMET) 50-1000 MG per tablet Take 1 tablet by mouth at bedtime.     [provider]  venlafaxine XR (EFFEXOR-XR) 150 MG 24 hr capsule Take 150 mg by mouth at bedtime.    [provider]    Allergies    Macrobid [nitrofurantoin macrocrystal] and Sulfa antibiotics  Review of Systems   Review of Systems  Constitutional: Positive for fatigue.  HENT: Positive for congestion and sore throat.   Eyes: Negative for visual disturbance.  Respiratory: Positive for cough and shortness of breath.   Cardiovascular: Negative for chest pain.  Gastrointestinal: Positive for diarrhea. Negative for abdominal pain.  Genitourinary: Negative for dysuria.  Musculoskeletal: Positive for myalgias.  Skin: Negative for rash.  Neurological: Negative for syncope.    Physical Exam Updated Vital Signs BP (!) 153/59   Pulse (!) 102   Temp 98.9 F (37.2 C) (Oral)   Resp 18   Ht 5\' 5"  (1.651 m)   Wt 83.9 kg   SpO2 95%   BMI 30.79 kg/m   Physical Exam Vitals and nursing note reviewed.  Constitutional:      General: She is not in acute distress.    Appearance: Normal appearance. She is well-developed and well-nourished.  HENT:     Head: Normocephalic and atraumatic.   Eyes:     Conjunctiva/sclera: Conjunctivae normal.  Cardiovascular:     Rate and Rhythm: Regular rhythm. Tachycardia present.     Heart sounds: No murmur heard.   Pulmonary:     Effort: Pulmonary effort is normal. No respiratory distress.     Breath sounds: Normal breath sounds.  Abdominal:     Palpations: Abdomen is soft.     Tenderness: There is no abdominal tenderness.  Musculoskeletal:        General: No deformity, signs of injury or edema. Normal range of motion.     Cervical back: Neck supple.  Skin:    General: Skin is warm and dry.     Capillary Refill: Capillary refill takes less than 2 seconds.  Neurological:     General: No focal deficit present.     Mental Status: She is alert.  Psychiatric:        Mood and Affect: Mood and affect normal.     ED Results / Procedures / Treatments   Labs (all labs ordered are listed, but only abnormal results are displayed) Labs Reviewed  BASIC METABOLIC PANEL - Abnormal; Notable for the following components:      Result Value   Sodium 133 (*)    Potassium 3.4 (*)    Glucose, Bld 244 (*)    Calcium 8.7 (*)    All other components within normal limits  CBC WITH DIFFERENTIAL/PLATELET    EKG None  Radiology No results found.  Procedures Procedures (including critical care time)  Medications Ordered in ED Medications  casirivimab-imdevimab (REGEN-COV) 1,200 mg in sodium chloride 0.9 % 110 mL IVPB (has no administration in time range)  0.9 %  sodium chloride infusion (has no administration in time range)  diphenhydrAMINE (BENADRYL) injection 50 mg (has no administration in time range)  famotidine (PEPCID) IVPB 20 mg premix (has no administration in time range)  methylPREDNISolone sodium succinate (SOLU-MEDROL) 125 mg/2 mL injection 125 mg (has no administration in time range)  albuterol (VENTOLIN HFA) 108 (90 Base) MCG/ACT inhaler 2 puff (has no administration in time range)  EPINEPHrine (EPI-PEN) injection 0.3 mg (has  no administration in time range)    ED Course  I have reviewed the triage vital signs and the nursing notes.  Pertinent labs & imaging results that were available during my care of the patient were reviewed by me and considered in my medical decision making (see chart for details).  Clinical Course as of 05/02/20 1717  Fri May 02, 2020  1248 Patient received her Mab infusion without any difficulties. [MB]    Clinical Course User Index [MB] Hayden Rasmussen, MD   MDM Rules/Calculators/A&P                         Shaleta Ruacho was evaluated in  Emergency Department on 05/02/2020 for the symptoms described in the history of present illness. She was evaluated in the context of the global COVID-19 pandemic, which necessitated consideration that the patient might be at risk for infection with the SARS-CoV-2 virus that causes COVID-19. Institutional protocols and algorithms that pertain to the evaluation of patients at risk for COVID-19 are in a state of rapid change based on information released by regulatory bodies including the CDC and federal and state organizations. These policies and algorithms were followed during the patient's care in the ED.  This patient complains of being Covid positive, cough shortness of breath myalgias diarrhea ; this involves an extensive number of treatment Options and is a complaint that carries with it a high risk of complications and Morbidity. The differential includes Covid, pneumonia, hypoxia, metabolic derangement, flu  I ordered, reviewed and interpreted labs, which included CBC with normal white count normal hemoglobin, chemistries fairly normal other than elevated glucose and low potassium I ordered medication monoclonal antibody infusion Previous records obtained and reviewed in epic, no recent admissions  After the interventions stated above, I reevaluated the patient and found her to be hemodynamically stable.  Satting mid 90%s in no respiratory  distress.  No indications for admission at this time.  Reviewed her results with her.  Return instructions discussed   Final Clinical Impression(s) / ED Diagnoses Final diagnoses:  COVID-19 virus infection    Rx / DC Orders ED Discharge Orders    None       Hayden Rasmussen, MD 05/02/20 1718

## 2022-11-29 ENCOUNTER — Ambulatory Visit: Payer: Self-pay | Admitting: Surgery
